# Patient Record
Sex: Female | Born: 1960
Health system: Southern US, Community
[De-identification: ages and names within clinical notes are randomized; demographics above are authoritative.]

## PROBLEM LIST (undated history)

## (undated) DIAGNOSIS — F1010 Alcohol abuse, uncomplicated: Secondary | ICD-10-CM

## (undated) DIAGNOSIS — E119 Type 2 diabetes mellitus without complications: Secondary | ICD-10-CM

## (undated) DIAGNOSIS — I10 Essential (primary) hypertension: Secondary | ICD-10-CM

## (undated) DIAGNOSIS — K852 Alcohol induced acute pancreatitis without necrosis or infection: Secondary | ICD-10-CM

## (undated) DIAGNOSIS — E079 Disorder of thyroid, unspecified: Secondary | ICD-10-CM

## (undated) DIAGNOSIS — E785 Hyperlipidemia, unspecified: Secondary | ICD-10-CM

## (undated) HISTORY — DX: Alcohol induced acute pancreatitis without necrosis or infection: K85.20

## (undated) HISTORY — DX: Disorder of thyroid, unspecified: E07.9

## (undated) HISTORY — DX: Hyperlipidemia, unspecified: E78.5

## (undated) HISTORY — DX: Essential (primary) hypertension: I10

## (undated) HISTORY — DX: Alcohol abuse, uncomplicated: F10.10

## (undated) HISTORY — DX: Type 2 diabetes mellitus without complications: E11.9

## (undated) HISTORY — PX: CHOLECYSTECTOMY: SHX55

## (undated) HISTORY — PX: ABDOMINAL HYSTERECTOMY: SHX81

---

## 2005-05-16 ENCOUNTER — Ambulatory Visit (HOSPITAL_COMMUNITY): Admission: RE | Admit: 2005-05-16 | Discharge: 2005-05-16 | Payer: Self-pay | Admitting: Orthopaedic Surgery

## 2005-11-04 ENCOUNTER — Ambulatory Visit (HOSPITAL_COMMUNITY): Admission: RE | Admit: 2005-11-04 | Discharge: 2005-11-04 | Payer: Self-pay | Admitting: Orthopaedic Surgery

## 2008-01-30 ENCOUNTER — Ambulatory Visit (HOSPITAL_COMMUNITY): Admission: RE | Admit: 2008-01-30 | Discharge: 2008-01-30 | Payer: Self-pay | Admitting: Orthopaedic Surgery

## 2009-08-05 ENCOUNTER — Ambulatory Visit: Payer: Self-pay | Admitting: Cardiology

## 2011-03-29 ENCOUNTER — Other Ambulatory Visit: Payer: Self-pay | Admitting: Family Medicine

## 2011-03-29 DIAGNOSIS — R921 Mammographic calcification found on diagnostic imaging of breast: Secondary | ICD-10-CM

## 2011-04-14 ENCOUNTER — Inpatient Hospital Stay: Admission: RE | Admit: 2011-04-14 | Payer: Self-pay | Source: Ambulatory Visit

## 2011-09-08 ENCOUNTER — Other Ambulatory Visit: Payer: Self-pay | Admitting: Diagnostic Radiology

## 2011-09-08 ENCOUNTER — Ambulatory Visit
Admission: RE | Admit: 2011-09-08 | Discharge: 2011-09-08 | Disposition: A | Discharge status: Home / Self Care | Payer: Medicare Other | Source: Ambulatory Visit | Attending: Family Medicine | Admitting: Family Medicine

## 2011-09-08 DIAGNOSIS — R921 Mammographic calcification found on diagnostic imaging of breast: Secondary | ICD-10-CM

## 2011-10-12 DIAGNOSIS — E039 Hypothyroidism, unspecified: Secondary | ICD-10-CM | POA: Diagnosis not present

## 2011-10-12 DIAGNOSIS — E876 Hypokalemia: Secondary | ICD-10-CM | POA: Diagnosis not present

## 2011-10-12 DIAGNOSIS — I10 Essential (primary) hypertension: Secondary | ICD-10-CM | POA: Diagnosis not present

## 2011-10-19 DIAGNOSIS — E7800 Pure hypercholesterolemia, unspecified: Secondary | ICD-10-CM | POA: Diagnosis not present

## 2011-10-19 DIAGNOSIS — IMO0001 Reserved for inherently not codable concepts without codable children: Secondary | ICD-10-CM | POA: Diagnosis not present

## 2011-10-19 DIAGNOSIS — Z23 Encounter for immunization: Secondary | ICD-10-CM | POA: Diagnosis not present

## 2011-10-19 DIAGNOSIS — F329 Major depressive disorder, single episode, unspecified: Secondary | ICD-10-CM | POA: Diagnosis not present

## 2011-10-19 DIAGNOSIS — G4700 Insomnia, unspecified: Secondary | ICD-10-CM | POA: Diagnosis not present

## 2011-10-19 DIAGNOSIS — F3289 Other specified depressive episodes: Secondary | ICD-10-CM | POA: Diagnosis not present

## 2012-02-09 DIAGNOSIS — IMO0001 Reserved for inherently not codable concepts without codable children: Secondary | ICD-10-CM | POA: Diagnosis not present

## 2012-02-09 DIAGNOSIS — E7800 Pure hypercholesterolemia, unspecified: Secondary | ICD-10-CM | POA: Diagnosis not present

## 2012-02-09 DIAGNOSIS — G4700 Insomnia, unspecified: Secondary | ICD-10-CM | POA: Diagnosis not present

## 2012-02-16 DIAGNOSIS — F3289 Other specified depressive episodes: Secondary | ICD-10-CM | POA: Diagnosis not present

## 2012-02-16 DIAGNOSIS — E7800 Pure hypercholesterolemia, unspecified: Secondary | ICD-10-CM | POA: Diagnosis not present

## 2012-02-16 DIAGNOSIS — IMO0001 Reserved for inherently not codable concepts without codable children: Secondary | ICD-10-CM | POA: Diagnosis not present

## 2012-02-16 DIAGNOSIS — G4700 Insomnia, unspecified: Secondary | ICD-10-CM | POA: Diagnosis not present

## 2012-02-16 DIAGNOSIS — F329 Major depressive disorder, single episode, unspecified: Secondary | ICD-10-CM | POA: Diagnosis not present

## 2012-02-21 DIAGNOSIS — I10 Essential (primary) hypertension: Secondary | ICD-10-CM | POA: Diagnosis not present

## 2012-02-21 DIAGNOSIS — Z79899 Other long term (current) drug therapy: Secondary | ICD-10-CM | POA: Diagnosis not present

## 2012-02-21 DIAGNOSIS — R0789 Other chest pain: Secondary | ICD-10-CM | POA: Diagnosis not present

## 2012-02-21 DIAGNOSIS — R079 Chest pain, unspecified: Secondary | ICD-10-CM | POA: Diagnosis not present

## 2012-02-28 DIAGNOSIS — R071 Chest pain on breathing: Secondary | ICD-10-CM | POA: Diagnosis not present

## 2012-06-19 DIAGNOSIS — F329 Major depressive disorder, single episode, unspecified: Secondary | ICD-10-CM | POA: Diagnosis not present

## 2012-06-19 DIAGNOSIS — F3289 Other specified depressive episodes: Secondary | ICD-10-CM | POA: Diagnosis not present

## 2012-06-19 DIAGNOSIS — E7800 Pure hypercholesterolemia, unspecified: Secondary | ICD-10-CM | POA: Diagnosis not present

## 2012-06-19 DIAGNOSIS — G4700 Insomnia, unspecified: Secondary | ICD-10-CM | POA: Diagnosis not present

## 2012-06-19 DIAGNOSIS — IMO0001 Reserved for inherently not codable concepts without codable children: Secondary | ICD-10-CM | POA: Diagnosis not present

## 2012-07-12 DIAGNOSIS — H3340 Traction detachment of retina, unspecified eye: Secondary | ICD-10-CM | POA: Diagnosis not present

## 2012-07-30 DIAGNOSIS — G4700 Insomnia, unspecified: Secondary | ICD-10-CM | POA: Diagnosis not present

## 2012-07-30 DIAGNOSIS — IMO0001 Reserved for inherently not codable concepts without codable children: Secondary | ICD-10-CM | POA: Diagnosis not present

## 2012-07-30 DIAGNOSIS — E7800 Pure hypercholesterolemia, unspecified: Secondary | ICD-10-CM | POA: Diagnosis not present

## 2012-08-02 DIAGNOSIS — F329 Major depressive disorder, single episode, unspecified: Secondary | ICD-10-CM | POA: Diagnosis not present

## 2012-08-02 DIAGNOSIS — IMO0001 Reserved for inherently not codable concepts without codable children: Secondary | ICD-10-CM | POA: Diagnosis not present

## 2012-08-02 DIAGNOSIS — G4700 Insomnia, unspecified: Secondary | ICD-10-CM | POA: Diagnosis not present

## 2012-08-02 DIAGNOSIS — E7800 Pure hypercholesterolemia, unspecified: Secondary | ICD-10-CM | POA: Diagnosis not present

## 2012-08-02 DIAGNOSIS — E109 Type 1 diabetes mellitus without complications: Secondary | ICD-10-CM | POA: Diagnosis not present

## 2012-08-02 DIAGNOSIS — F3289 Other specified depressive episodes: Secondary | ICD-10-CM | POA: Diagnosis not present

## 2012-10-17 DIAGNOSIS — Z23 Encounter for immunization: Secondary | ICD-10-CM | POA: Diagnosis not present

## 2012-11-21 DIAGNOSIS — IMO0001 Reserved for inherently not codable concepts without codable children: Secondary | ICD-10-CM | POA: Diagnosis not present

## 2012-11-21 DIAGNOSIS — E7800 Pure hypercholesterolemia, unspecified: Secondary | ICD-10-CM | POA: Diagnosis not present

## 2012-11-21 DIAGNOSIS — F3289 Other specified depressive episodes: Secondary | ICD-10-CM | POA: Diagnosis not present

## 2012-11-21 DIAGNOSIS — F329 Major depressive disorder, single episode, unspecified: Secondary | ICD-10-CM | POA: Diagnosis not present

## 2012-11-29 DIAGNOSIS — K219 Gastro-esophageal reflux disease without esophagitis: Secondary | ICD-10-CM | POA: Diagnosis not present

## 2012-11-29 DIAGNOSIS — IMO0001 Reserved for inherently not codable concepts without codable children: Secondary | ICD-10-CM | POA: Diagnosis not present

## 2012-11-29 DIAGNOSIS — I10 Essential (primary) hypertension: Secondary | ICD-10-CM | POA: Diagnosis not present

## 2012-11-29 DIAGNOSIS — F3289 Other specified depressive episodes: Secondary | ICD-10-CM | POA: Diagnosis not present

## 2012-11-29 DIAGNOSIS — E7800 Pure hypercholesterolemia, unspecified: Secondary | ICD-10-CM | POA: Diagnosis not present

## 2012-11-29 DIAGNOSIS — E039 Hypothyroidism, unspecified: Secondary | ICD-10-CM | POA: Diagnosis not present

## 2013-01-24 DIAGNOSIS — Z1231 Encounter for screening mammogram for malignant neoplasm of breast: Secondary | ICD-10-CM | POA: Diagnosis not present

## 2013-04-16 DIAGNOSIS — R5381 Other malaise: Secondary | ICD-10-CM | POA: Diagnosis not present

## 2013-04-16 DIAGNOSIS — E039 Hypothyroidism, unspecified: Secondary | ICD-10-CM | POA: Diagnosis not present

## 2013-04-16 DIAGNOSIS — R5383 Other fatigue: Secondary | ICD-10-CM | POA: Diagnosis not present

## 2013-05-13 DIAGNOSIS — S92919A Unspecified fracture of unspecified toe(s), initial encounter for closed fracture: Secondary | ICD-10-CM | POA: Diagnosis not present

## 2013-05-13 DIAGNOSIS — S20229A Contusion of unspecified back wall of thorax, initial encounter: Secondary | ICD-10-CM | POA: Diagnosis not present

## 2013-05-13 DIAGNOSIS — M545 Low back pain, unspecified: Secondary | ICD-10-CM | POA: Diagnosis not present

## 2013-05-13 DIAGNOSIS — I10 Essential (primary) hypertension: Secondary | ICD-10-CM | POA: Diagnosis not present

## 2013-05-13 DIAGNOSIS — Z79899 Other long term (current) drug therapy: Secondary | ICD-10-CM | POA: Diagnosis not present

## 2013-05-13 DIAGNOSIS — IMO0002 Reserved for concepts with insufficient information to code with codable children: Secondary | ICD-10-CM | POA: Diagnosis not present

## 2013-05-13 DIAGNOSIS — E119 Type 2 diabetes mellitus without complications: Secondary | ICD-10-CM | POA: Diagnosis not present

## 2013-05-22 DIAGNOSIS — IMO0001 Reserved for inherently not codable concepts without codable children: Secondary | ICD-10-CM | POA: Diagnosis not present

## 2013-05-22 DIAGNOSIS — K219 Gastro-esophageal reflux disease without esophagitis: Secondary | ICD-10-CM | POA: Diagnosis not present

## 2013-05-22 DIAGNOSIS — E876 Hypokalemia: Secondary | ICD-10-CM | POA: Diagnosis not present

## 2013-05-22 DIAGNOSIS — E7800 Pure hypercholesterolemia, unspecified: Secondary | ICD-10-CM | POA: Diagnosis not present

## 2013-05-22 DIAGNOSIS — E039 Hypothyroidism, unspecified: Secondary | ICD-10-CM | POA: Diagnosis not present

## 2013-05-22 DIAGNOSIS — I10 Essential (primary) hypertension: Secondary | ICD-10-CM | POA: Diagnosis not present

## 2013-05-24 DIAGNOSIS — IMO0001 Reserved for inherently not codable concepts without codable children: Secondary | ICD-10-CM | POA: Diagnosis not present

## 2013-05-24 DIAGNOSIS — E039 Hypothyroidism, unspecified: Secondary | ICD-10-CM | POA: Diagnosis not present

## 2013-05-24 DIAGNOSIS — S92919A Unspecified fracture of unspecified toe(s), initial encounter for closed fracture: Secondary | ICD-10-CM | POA: Diagnosis not present

## 2013-06-17 DIAGNOSIS — S92919A Unspecified fracture of unspecified toe(s), initial encounter for closed fracture: Secondary | ICD-10-CM | POA: Diagnosis not present

## 2013-07-31 DIAGNOSIS — Z23 Encounter for immunization: Secondary | ICD-10-CM | POA: Diagnosis not present

## 2013-08-12 DIAGNOSIS — J019 Acute sinusitis, unspecified: Secondary | ICD-10-CM | POA: Diagnosis not present

## 2013-08-12 DIAGNOSIS — J45901 Unspecified asthma with (acute) exacerbation: Secondary | ICD-10-CM | POA: Diagnosis not present

## 2013-08-19 DIAGNOSIS — S92919A Unspecified fracture of unspecified toe(s), initial encounter for closed fracture: Secondary | ICD-10-CM | POA: Diagnosis not present

## 2013-08-19 DIAGNOSIS — S90129A Contusion of unspecified lesser toe(s) without damage to nail, initial encounter: Secondary | ICD-10-CM | POA: Diagnosis not present

## 2013-08-30 DIAGNOSIS — F325 Major depressive disorder, single episode, in full remission: Secondary | ICD-10-CM | POA: Diagnosis not present

## 2013-08-30 DIAGNOSIS — Z23 Encounter for immunization: Secondary | ICD-10-CM | POA: Diagnosis not present

## 2013-08-30 DIAGNOSIS — E039 Hypothyroidism, unspecified: Secondary | ICD-10-CM | POA: Diagnosis not present

## 2013-08-30 DIAGNOSIS — N19 Unspecified kidney failure: Secondary | ICD-10-CM | POA: Diagnosis not present

## 2013-08-30 DIAGNOSIS — E119 Type 2 diabetes mellitus without complications: Secondary | ICD-10-CM | POA: Diagnosis not present

## 2013-08-30 DIAGNOSIS — K5289 Other specified noninfective gastroenteritis and colitis: Secondary | ICD-10-CM | POA: Diagnosis not present

## 2013-08-30 DIAGNOSIS — N289 Disorder of kidney and ureter, unspecified: Secondary | ICD-10-CM | POA: Diagnosis not present

## 2013-08-30 DIAGNOSIS — K219 Gastro-esophageal reflux disease without esophagitis: Secondary | ICD-10-CM | POA: Diagnosis not present

## 2013-08-30 DIAGNOSIS — E871 Hypo-osmolality and hyponatremia: Secondary | ICD-10-CM | POA: Diagnosis not present

## 2013-08-30 DIAGNOSIS — Z8249 Family history of ischemic heart disease and other diseases of the circulatory system: Secondary | ICD-10-CM | POA: Diagnosis not present

## 2013-08-30 DIAGNOSIS — I959 Hypotension, unspecified: Secondary | ICD-10-CM | POA: Diagnosis not present

## 2013-08-30 DIAGNOSIS — Z833 Family history of diabetes mellitus: Secondary | ICD-10-CM | POA: Diagnosis not present

## 2013-08-30 DIAGNOSIS — E860 Dehydration: Secondary | ICD-10-CM | POA: Diagnosis not present

## 2013-08-30 DIAGNOSIS — R1110 Vomiting, unspecified: Secondary | ICD-10-CM | POA: Diagnosis not present

## 2013-08-30 DIAGNOSIS — I10 Essential (primary) hypertension: Secondary | ICD-10-CM | POA: Diagnosis not present

## 2013-08-30 DIAGNOSIS — Z79899 Other long term (current) drug therapy: Secondary | ICD-10-CM | POA: Diagnosis not present

## 2013-08-30 DIAGNOSIS — Z885 Allergy status to narcotic agent status: Secondary | ICD-10-CM | POA: Diagnosis not present

## 2013-08-31 DIAGNOSIS — E871 Hypo-osmolality and hyponatremia: Secondary | ICD-10-CM | POA: Diagnosis not present

## 2013-08-31 DIAGNOSIS — N19 Unspecified kidney failure: Secondary | ICD-10-CM | POA: Diagnosis not present

## 2013-09-09 DIAGNOSIS — E7800 Pure hypercholesterolemia, unspecified: Secondary | ICD-10-CM | POA: Diagnosis not present

## 2013-09-09 DIAGNOSIS — I10 Essential (primary) hypertension: Secondary | ICD-10-CM | POA: Diagnosis not present

## 2013-09-09 DIAGNOSIS — E876 Hypokalemia: Secondary | ICD-10-CM | POA: Diagnosis not present

## 2013-09-09 DIAGNOSIS — E039 Hypothyroidism, unspecified: Secondary | ICD-10-CM | POA: Diagnosis not present

## 2013-09-09 DIAGNOSIS — IMO0001 Reserved for inherently not codable concepts without codable children: Secondary | ICD-10-CM | POA: Diagnosis not present

## 2013-09-09 DIAGNOSIS — K219 Gastro-esophageal reflux disease without esophagitis: Secondary | ICD-10-CM | POA: Diagnosis not present

## 2013-09-09 DIAGNOSIS — E559 Vitamin D deficiency, unspecified: Secondary | ICD-10-CM | POA: Diagnosis not present

## 2013-09-09 DIAGNOSIS — E538 Deficiency of other specified B group vitamins: Secondary | ICD-10-CM | POA: Diagnosis not present

## 2013-09-29 DIAGNOSIS — E860 Dehydration: Secondary | ICD-10-CM | POA: Diagnosis not present

## 2013-09-29 DIAGNOSIS — E039 Hypothyroidism, unspecified: Secondary | ICD-10-CM | POA: Diagnosis not present

## 2013-11-18 DIAGNOSIS — E876 Hypokalemia: Secondary | ICD-10-CM | POA: Diagnosis not present

## 2013-11-18 DIAGNOSIS — IMO0001 Reserved for inherently not codable concepts without codable children: Secondary | ICD-10-CM | POA: Diagnosis not present

## 2013-11-18 DIAGNOSIS — E039 Hypothyroidism, unspecified: Secondary | ICD-10-CM | POA: Diagnosis not present

## 2013-11-18 DIAGNOSIS — K219 Gastro-esophageal reflux disease without esophagitis: Secondary | ICD-10-CM | POA: Diagnosis not present

## 2013-11-18 DIAGNOSIS — R5383 Other fatigue: Secondary | ICD-10-CM | POA: Diagnosis not present

## 2013-11-18 DIAGNOSIS — E7800 Pure hypercholesterolemia, unspecified: Secondary | ICD-10-CM | POA: Diagnosis not present

## 2013-11-18 DIAGNOSIS — R5381 Other malaise: Secondary | ICD-10-CM | POA: Diagnosis not present

## 2013-11-18 DIAGNOSIS — I10 Essential (primary) hypertension: Secondary | ICD-10-CM | POA: Diagnosis not present

## 2013-11-18 DIAGNOSIS — E782 Mixed hyperlipidemia: Secondary | ICD-10-CM | POA: Diagnosis not present

## 2013-11-25 DIAGNOSIS — R5383 Other fatigue: Secondary | ICD-10-CM | POA: Diagnosis not present

## 2013-11-25 DIAGNOSIS — IMO0001 Reserved for inherently not codable concepts without codable children: Secondary | ICD-10-CM | POA: Diagnosis not present

## 2013-11-25 DIAGNOSIS — G4700 Insomnia, unspecified: Secondary | ICD-10-CM | POA: Diagnosis not present

## 2013-11-25 DIAGNOSIS — E039 Hypothyroidism, unspecified: Secondary | ICD-10-CM | POA: Diagnosis not present

## 2013-11-25 DIAGNOSIS — E7800 Pure hypercholesterolemia, unspecified: Secondary | ICD-10-CM | POA: Diagnosis not present

## 2013-11-25 DIAGNOSIS — R5381 Other malaise: Secondary | ICD-10-CM | POA: Diagnosis not present

## 2014-02-04 DIAGNOSIS — M67919 Unspecified disorder of synovium and tendon, unspecified shoulder: Secondary | ICD-10-CM | POA: Diagnosis not present

## 2014-04-02 DIAGNOSIS — Z1231 Encounter for screening mammogram for malignant neoplasm of breast: Secondary | ICD-10-CM | POA: Diagnosis not present

## 2014-05-19 DIAGNOSIS — E039 Hypothyroidism, unspecified: Secondary | ICD-10-CM | POA: Diagnosis not present

## 2014-05-19 DIAGNOSIS — E7800 Pure hypercholesterolemia, unspecified: Secondary | ICD-10-CM | POA: Diagnosis not present

## 2014-05-19 DIAGNOSIS — K219 Gastro-esophageal reflux disease without esophagitis: Secondary | ICD-10-CM | POA: Diagnosis not present

## 2014-05-19 DIAGNOSIS — E876 Hypokalemia: Secondary | ICD-10-CM | POA: Diagnosis not present

## 2014-05-19 DIAGNOSIS — IMO0001 Reserved for inherently not codable concepts without codable children: Secondary | ICD-10-CM | POA: Diagnosis not present

## 2014-05-19 DIAGNOSIS — I10 Essential (primary) hypertension: Secondary | ICD-10-CM | POA: Diagnosis not present

## 2014-05-26 DIAGNOSIS — K219 Gastro-esophageal reflux disease without esophagitis: Secondary | ICD-10-CM | POA: Diagnosis not present

## 2014-05-26 DIAGNOSIS — E039 Hypothyroidism, unspecified: Secondary | ICD-10-CM | POA: Diagnosis not present

## 2014-05-26 DIAGNOSIS — IMO0001 Reserved for inherently not codable concepts without codable children: Secondary | ICD-10-CM | POA: Diagnosis not present

## 2014-05-31 DIAGNOSIS — Z79899 Other long term (current) drug therapy: Secondary | ICD-10-CM | POA: Diagnosis not present

## 2014-05-31 DIAGNOSIS — K219 Gastro-esophageal reflux disease without esophagitis: Secondary | ICD-10-CM | POA: Diagnosis not present

## 2014-05-31 DIAGNOSIS — F329 Major depressive disorder, single episode, unspecified: Secondary | ICD-10-CM | POA: Diagnosis not present

## 2014-05-31 DIAGNOSIS — D509 Iron deficiency anemia, unspecified: Secondary | ICD-10-CM | POA: Diagnosis not present

## 2014-05-31 DIAGNOSIS — E119 Type 2 diabetes mellitus without complications: Secondary | ICD-10-CM | POA: Diagnosis not present

## 2014-05-31 DIAGNOSIS — E039 Hypothyroidism, unspecified: Secondary | ICD-10-CM | POA: Diagnosis not present

## 2014-05-31 DIAGNOSIS — F3289 Other specified depressive episodes: Secondary | ICD-10-CM | POA: Diagnosis not present

## 2014-05-31 DIAGNOSIS — E878 Other disorders of electrolyte and fluid balance, not elsewhere classified: Secondary | ICD-10-CM | POA: Diagnosis not present

## 2014-05-31 DIAGNOSIS — Z885 Allergy status to narcotic agent status: Secondary | ICD-10-CM | POA: Diagnosis not present

## 2014-05-31 DIAGNOSIS — J9819 Other pulmonary collapse: Secondary | ICD-10-CM | POA: Diagnosis not present

## 2014-05-31 DIAGNOSIS — I10 Essential (primary) hypertension: Secondary | ICD-10-CM | POA: Diagnosis not present

## 2014-05-31 DIAGNOSIS — E871 Hypo-osmolality and hyponatremia: Secondary | ICD-10-CM | POA: Diagnosis not present

## 2014-06-01 DIAGNOSIS — E871 Hypo-osmolality and hyponatremia: Secondary | ICD-10-CM | POA: Diagnosis not present

## 2014-06-02 DIAGNOSIS — E871 Hypo-osmolality and hyponatremia: Secondary | ICD-10-CM | POA: Diagnosis not present

## 2014-06-03 DIAGNOSIS — E871 Hypo-osmolality and hyponatremia: Secondary | ICD-10-CM | POA: Diagnosis not present

## 2014-06-03 DIAGNOSIS — R112 Nausea with vomiting, unspecified: Secondary | ICD-10-CM | POA: Diagnosis not present

## 2014-06-17 DIAGNOSIS — E039 Hypothyroidism, unspecified: Secondary | ICD-10-CM | POA: Diagnosis not present

## 2014-06-17 DIAGNOSIS — I10 Essential (primary) hypertension: Secondary | ICD-10-CM | POA: Diagnosis not present

## 2014-06-17 DIAGNOSIS — E871 Hypo-osmolality and hyponatremia: Secondary | ICD-10-CM | POA: Diagnosis not present

## 2014-08-12 DIAGNOSIS — I10 Essential (primary) hypertension: Secondary | ICD-10-CM | POA: Diagnosis not present

## 2014-08-12 DIAGNOSIS — E780 Pure hypercholesterolemia: Secondary | ICD-10-CM | POA: Diagnosis not present

## 2014-08-12 DIAGNOSIS — D649 Anemia, unspecified: Secondary | ICD-10-CM | POA: Diagnosis not present

## 2014-08-12 DIAGNOSIS — R5383 Other fatigue: Secondary | ICD-10-CM | POA: Diagnosis not present

## 2014-08-29 DIAGNOSIS — F329 Major depressive disorder, single episode, unspecified: Secondary | ICD-10-CM | POA: Diagnosis not present

## 2014-09-02 DIAGNOSIS — R531 Weakness: Secondary | ICD-10-CM | POA: Diagnosis not present

## 2014-09-02 DIAGNOSIS — R634 Abnormal weight loss: Secondary | ICD-10-CM | POA: Diagnosis not present

## 2014-09-02 DIAGNOSIS — E038 Other specified hypothyroidism: Secondary | ICD-10-CM | POA: Diagnosis not present

## 2014-11-04 DIAGNOSIS — E1165 Type 2 diabetes mellitus with hyperglycemia: Secondary | ICD-10-CM | POA: Diagnosis not present

## 2014-11-04 DIAGNOSIS — E780 Pure hypercholesterolemia: Secondary | ICD-10-CM | POA: Diagnosis not present

## 2014-11-04 DIAGNOSIS — E038 Other specified hypothyroidism: Secondary | ICD-10-CM | POA: Diagnosis not present

## 2014-11-04 DIAGNOSIS — K219 Gastro-esophageal reflux disease without esophagitis: Secondary | ICD-10-CM | POA: Diagnosis not present

## 2014-11-04 DIAGNOSIS — I10 Essential (primary) hypertension: Secondary | ICD-10-CM | POA: Diagnosis not present

## 2014-11-11 DIAGNOSIS — E780 Pure hypercholesterolemia: Secondary | ICD-10-CM | POA: Diagnosis not present

## 2014-11-11 DIAGNOSIS — Z1389 Encounter for screening for other disorder: Secondary | ICD-10-CM | POA: Diagnosis not present

## 2014-11-11 DIAGNOSIS — I10 Essential (primary) hypertension: Secondary | ICD-10-CM | POA: Diagnosis not present

## 2014-11-11 DIAGNOSIS — E038 Other specified hypothyroidism: Secondary | ICD-10-CM | POA: Diagnosis not present

## 2014-11-11 DIAGNOSIS — E1165 Type 2 diabetes mellitus with hyperglycemia: Secondary | ICD-10-CM | POA: Diagnosis not present

## 2014-11-11 DIAGNOSIS — Z0000 Encounter for general adult medical examination without abnormal findings: Secondary | ICD-10-CM | POA: Diagnosis not present

## 2014-11-11 DIAGNOSIS — F328 Other depressive episodes: Secondary | ICD-10-CM | POA: Diagnosis not present

## 2014-11-11 DIAGNOSIS — E876 Hypokalemia: Secondary | ICD-10-CM | POA: Diagnosis not present

## 2014-11-11 DIAGNOSIS — K219 Gastro-esophageal reflux disease without esophagitis: Secondary | ICD-10-CM | POA: Diagnosis not present

## 2015-02-12 DIAGNOSIS — E119 Type 2 diabetes mellitus without complications: Secondary | ICD-10-CM | POA: Diagnosis not present

## 2015-04-07 DIAGNOSIS — Z1231 Encounter for screening mammogram for malignant neoplasm of breast: Secondary | ICD-10-CM | POA: Diagnosis not present

## 2015-04-27 DIAGNOSIS — L509 Urticaria, unspecified: Secondary | ICD-10-CM | POA: Diagnosis not present

## 2015-04-27 DIAGNOSIS — R197 Diarrhea, unspecified: Secondary | ICD-10-CM | POA: Diagnosis not present

## 2015-05-11 DIAGNOSIS — E780 Pure hypercholesterolemia: Secondary | ICD-10-CM | POA: Diagnosis not present

## 2015-05-11 DIAGNOSIS — I10 Essential (primary) hypertension: Secondary | ICD-10-CM | POA: Diagnosis not present

## 2015-05-11 DIAGNOSIS — R5383 Other fatigue: Secondary | ICD-10-CM | POA: Diagnosis not present

## 2015-05-11 DIAGNOSIS — E1165 Type 2 diabetes mellitus with hyperglycemia: Secondary | ICD-10-CM | POA: Diagnosis not present

## 2015-05-11 DIAGNOSIS — K219 Gastro-esophageal reflux disease without esophagitis: Secondary | ICD-10-CM | POA: Diagnosis not present

## 2015-05-18 DIAGNOSIS — K219 Gastro-esophageal reflux disease without esophagitis: Secondary | ICD-10-CM | POA: Diagnosis not present

## 2015-05-18 DIAGNOSIS — E1165 Type 2 diabetes mellitus with hyperglycemia: Secondary | ICD-10-CM | POA: Diagnosis not present

## 2015-05-18 DIAGNOSIS — I10 Essential (primary) hypertension: Secondary | ICD-10-CM | POA: Diagnosis not present

## 2015-05-18 DIAGNOSIS — E780 Pure hypercholesterolemia: Secondary | ICD-10-CM | POA: Diagnosis not present

## 2015-05-18 DIAGNOSIS — E038 Other specified hypothyroidism: Secondary | ICD-10-CM | POA: Diagnosis not present

## 2015-05-18 DIAGNOSIS — E876 Hypokalemia: Secondary | ICD-10-CM | POA: Diagnosis not present

## 2015-05-18 DIAGNOSIS — F328 Other depressive episodes: Secondary | ICD-10-CM | POA: Diagnosis not present

## 2015-05-18 DIAGNOSIS — Z0000 Encounter for general adult medical examination without abnormal findings: Secondary | ICD-10-CM | POA: Diagnosis not present

## 2015-07-02 DIAGNOSIS — M79675 Pain in left toe(s): Secondary | ICD-10-CM | POA: Diagnosis not present

## 2015-07-02 DIAGNOSIS — M79672 Pain in left foot: Secondary | ICD-10-CM | POA: Diagnosis not present

## 2015-07-02 DIAGNOSIS — S92322A Displaced fracture of second metatarsal bone, left foot, initial encounter for closed fracture: Secondary | ICD-10-CM | POA: Diagnosis not present

## 2015-07-30 DIAGNOSIS — S92322D Displaced fracture of second metatarsal bone, left foot, subsequent encounter for fracture with routine healing: Secondary | ICD-10-CM | POA: Diagnosis not present

## 2015-07-30 DIAGNOSIS — M79671 Pain in right foot: Secondary | ICD-10-CM | POA: Diagnosis not present

## 2015-11-24 DIAGNOSIS — K219 Gastro-esophageal reflux disease without esophagitis: Secondary | ICD-10-CM | POA: Diagnosis not present

## 2015-11-24 DIAGNOSIS — I10 Essential (primary) hypertension: Secondary | ICD-10-CM | POA: Diagnosis not present

## 2015-11-24 DIAGNOSIS — E038 Other specified hypothyroidism: Secondary | ICD-10-CM | POA: Diagnosis not present

## 2015-11-24 DIAGNOSIS — E876 Hypokalemia: Secondary | ICD-10-CM | POA: Diagnosis not present

## 2015-11-24 DIAGNOSIS — E7800 Pure hypercholesterolemia, unspecified: Secondary | ICD-10-CM | POA: Diagnosis not present

## 2015-11-24 DIAGNOSIS — E1165 Type 2 diabetes mellitus with hyperglycemia: Secondary | ICD-10-CM | POA: Diagnosis not present

## 2015-12-01 DIAGNOSIS — E1165 Type 2 diabetes mellitus with hyperglycemia: Secondary | ICD-10-CM | POA: Diagnosis not present

## 2015-12-01 DIAGNOSIS — E038 Other specified hypothyroidism: Secondary | ICD-10-CM | POA: Diagnosis not present

## 2015-12-01 DIAGNOSIS — I10 Essential (primary) hypertension: Secondary | ICD-10-CM | POA: Diagnosis not present

## 2015-12-01 DIAGNOSIS — K219 Gastro-esophageal reflux disease without esophagitis: Secondary | ICD-10-CM | POA: Diagnosis not present

## 2015-12-01 DIAGNOSIS — Z1389 Encounter for screening for other disorder: Secondary | ICD-10-CM | POA: Diagnosis not present

## 2015-12-01 DIAGNOSIS — Z0001 Encounter for general adult medical examination with abnormal findings: Secondary | ICD-10-CM | POA: Diagnosis not present

## 2015-12-28 DIAGNOSIS — E860 Dehydration: Secondary | ICD-10-CM | POA: Diagnosis not present

## 2015-12-28 DIAGNOSIS — K760 Fatty (change of) liver, not elsewhere classified: Secondary | ICD-10-CM | POA: Diagnosis not present

## 2015-12-28 DIAGNOSIS — E119 Type 2 diabetes mellitus without complications: Secondary | ICD-10-CM | POA: Diagnosis not present

## 2015-12-28 DIAGNOSIS — R110 Nausea: Secondary | ICD-10-CM | POA: Diagnosis not present

## 2015-12-28 DIAGNOSIS — I10 Essential (primary) hypertension: Secondary | ICD-10-CM | POA: Diagnosis not present

## 2015-12-28 DIAGNOSIS — E876 Hypokalemia: Secondary | ICD-10-CM | POA: Diagnosis not present

## 2015-12-28 DIAGNOSIS — R935 Abnormal findings on diagnostic imaging of other abdominal regions, including retroperitoneum: Secondary | ICD-10-CM | POA: Diagnosis not present

## 2015-12-28 DIAGNOSIS — R109 Unspecified abdominal pain: Secondary | ICD-10-CM | POA: Diagnosis not present

## 2015-12-28 DIAGNOSIS — N289 Disorder of kidney and ureter, unspecified: Secondary | ICD-10-CM | POA: Diagnosis not present

## 2015-12-28 DIAGNOSIS — K8689 Other specified diseases of pancreas: Secondary | ICD-10-CM | POA: Diagnosis not present

## 2015-12-28 DIAGNOSIS — E039 Hypothyroidism, unspecified: Secondary | ICD-10-CM | POA: Diagnosis not present

## 2015-12-28 DIAGNOSIS — K219 Gastro-esophageal reflux disease without esophagitis: Secondary | ICD-10-CM | POA: Diagnosis not present

## 2015-12-28 DIAGNOSIS — K859 Acute pancreatitis without necrosis or infection, unspecified: Secondary | ICD-10-CM | POA: Diagnosis not present

## 2015-12-28 DIAGNOSIS — K858 Other acute pancreatitis without necrosis or infection: Secondary | ICD-10-CM | POA: Diagnosis not present

## 2015-12-28 DIAGNOSIS — I9589 Other hypotension: Secondary | ICD-10-CM | POA: Diagnosis not present

## 2016-01-29 DIAGNOSIS — E038 Other specified hypothyroidism: Secondary | ICD-10-CM | POA: Diagnosis not present

## 2016-01-29 DIAGNOSIS — E1165 Type 2 diabetes mellitus with hyperglycemia: Secondary | ICD-10-CM | POA: Diagnosis not present

## 2016-01-29 DIAGNOSIS — R634 Abnormal weight loss: Secondary | ICD-10-CM | POA: Diagnosis not present

## 2016-01-29 DIAGNOSIS — I10 Essential (primary) hypertension: Secondary | ICD-10-CM | POA: Diagnosis not present

## 2016-01-30 DIAGNOSIS — K8689 Other specified diseases of pancreas: Secondary | ICD-10-CM | POA: Diagnosis not present

## 2016-01-30 DIAGNOSIS — R634 Abnormal weight loss: Secondary | ICD-10-CM | POA: Diagnosis not present

## 2016-01-30 DIAGNOSIS — E876 Hypokalemia: Secondary | ICD-10-CM | POA: Diagnosis not present

## 2016-01-30 DIAGNOSIS — K859 Acute pancreatitis without necrosis or infection, unspecified: Secondary | ICD-10-CM | POA: Diagnosis not present

## 2016-02-12 DIAGNOSIS — K8500 Idiopathic acute pancreatitis without necrosis or infection: Secondary | ICD-10-CM | POA: Diagnosis not present

## 2016-02-12 DIAGNOSIS — I10 Essential (primary) hypertension: Secondary | ICD-10-CM | POA: Diagnosis not present

## 2016-02-12 DIAGNOSIS — R935 Abnormal findings on diagnostic imaging of other abdominal regions, including retroperitoneum: Secondary | ICD-10-CM | POA: Diagnosis not present

## 2016-02-12 DIAGNOSIS — E039 Hypothyroidism, unspecified: Secondary | ICD-10-CM | POA: Diagnosis not present

## 2016-02-12 DIAGNOSIS — Z79899 Other long term (current) drug therapy: Secondary | ICD-10-CM | POA: Diagnosis not present

## 2016-02-12 DIAGNOSIS — E119 Type 2 diabetes mellitus without complications: Secondary | ICD-10-CM | POA: Diagnosis not present

## 2016-03-09 DIAGNOSIS — E119 Type 2 diabetes mellitus without complications: Secondary | ICD-10-CM | POA: Diagnosis not present

## 2016-03-09 DIAGNOSIS — F329 Major depressive disorder, single episode, unspecified: Secondary | ICD-10-CM | POA: Diagnosis not present

## 2016-03-09 DIAGNOSIS — Z8711 Personal history of peptic ulcer disease: Secondary | ICD-10-CM | POA: Diagnosis not present

## 2016-03-09 DIAGNOSIS — K8500 Idiopathic acute pancreatitis without necrosis or infection: Secondary | ICD-10-CM | POA: Diagnosis not present

## 2016-03-09 DIAGNOSIS — E039 Hypothyroidism, unspecified: Secondary | ICD-10-CM | POA: Diagnosis not present

## 2016-03-09 DIAGNOSIS — K219 Gastro-esophageal reflux disease without esophagitis: Secondary | ICD-10-CM | POA: Diagnosis not present

## 2016-03-09 DIAGNOSIS — D509 Iron deficiency anemia, unspecified: Secondary | ICD-10-CM | POA: Diagnosis not present

## 2016-03-09 DIAGNOSIS — I10 Essential (primary) hypertension: Secondary | ICD-10-CM | POA: Diagnosis not present

## 2016-03-09 DIAGNOSIS — K861 Other chronic pancreatitis: Secondary | ICD-10-CM | POA: Diagnosis not present

## 2016-03-09 DIAGNOSIS — Z885 Allergy status to narcotic agent status: Secondary | ICD-10-CM | POA: Diagnosis not present

## 2016-04-12 DIAGNOSIS — K869 Disease of pancreas, unspecified: Secondary | ICD-10-CM | POA: Diagnosis not present

## 2016-04-25 DIAGNOSIS — Z1231 Encounter for screening mammogram for malignant neoplasm of breast: Secondary | ICD-10-CM | POA: Diagnosis not present

## 2016-05-11 DIAGNOSIS — R922 Inconclusive mammogram: Secondary | ICD-10-CM | POA: Diagnosis not present

## 2016-05-11 DIAGNOSIS — N6489 Other specified disorders of breast: Secondary | ICD-10-CM | POA: Diagnosis not present

## 2016-05-11 DIAGNOSIS — R928 Other abnormal and inconclusive findings on diagnostic imaging of breast: Secondary | ICD-10-CM | POA: Diagnosis not present

## 2016-05-18 DIAGNOSIS — E876 Hypokalemia: Secondary | ICD-10-CM | POA: Diagnosis not present

## 2016-05-18 DIAGNOSIS — E1165 Type 2 diabetes mellitus with hyperglycemia: Secondary | ICD-10-CM | POA: Diagnosis not present

## 2016-05-18 DIAGNOSIS — I10 Essential (primary) hypertension: Secondary | ICD-10-CM | POA: Diagnosis not present

## 2016-05-18 DIAGNOSIS — E7800 Pure hypercholesterolemia, unspecified: Secondary | ICD-10-CM | POA: Diagnosis not present

## 2016-05-18 DIAGNOSIS — Z13220 Encounter for screening for lipoid disorders: Secondary | ICD-10-CM | POA: Diagnosis not present

## 2016-05-18 DIAGNOSIS — R634 Abnormal weight loss: Secondary | ICD-10-CM | POA: Diagnosis not present

## 2016-05-18 DIAGNOSIS — E860 Dehydration: Secondary | ICD-10-CM | POA: Diagnosis not present

## 2016-05-18 DIAGNOSIS — K219 Gastro-esophageal reflux disease without esophagitis: Secondary | ICD-10-CM | POA: Diagnosis not present

## 2016-05-25 DIAGNOSIS — Z6820 Body mass index (BMI) 20.0-20.9, adult: Secondary | ICD-10-CM | POA: Diagnosis not present

## 2016-05-25 DIAGNOSIS — K219 Gastro-esophageal reflux disease without esophagitis: Secondary | ICD-10-CM | POA: Diagnosis not present

## 2016-05-25 DIAGNOSIS — K859 Acute pancreatitis without necrosis or infection, unspecified: Secondary | ICD-10-CM | POA: Diagnosis not present

## 2016-05-25 DIAGNOSIS — R5383 Other fatigue: Secondary | ICD-10-CM | POA: Diagnosis not present

## 2016-05-25 DIAGNOSIS — I10 Essential (primary) hypertension: Secondary | ICD-10-CM | POA: Diagnosis not present

## 2016-05-25 DIAGNOSIS — E038 Other specified hypothyroidism: Secondary | ICD-10-CM | POA: Diagnosis not present

## 2016-05-25 DIAGNOSIS — E1165 Type 2 diabetes mellitus with hyperglycemia: Secondary | ICD-10-CM | POA: Diagnosis not present

## 2016-07-07 ENCOUNTER — Encounter: Payer: Self-pay | Admitting: Gastroenterology

## 2016-07-13 ENCOUNTER — Encounter (INDEPENDENT_AMBULATORY_CARE_PROVIDER_SITE_OTHER): Payer: Medicare Other | Admitting: Gastroenterology

## 2016-07-13 ENCOUNTER — Telehealth: Payer: Self-pay | Admitting: Gastroenterology

## 2016-07-13 NOTE — Progress Notes (Signed)
   Subjective:    Patient ID: Kayla SpangleAnnie D Beebe, female    DOB: 06/24/1961, 55 y.o.   MRN: [id number]  NO SHOW  HPI    Review of Systems     Objective:   Physical Exam        Assessment & Plan:

## 2016-07-13 NOTE — Telephone Encounter (Signed)
REVIEWED-NO ADDITIONAL RECOMMENDATIONS. 

## 2016-07-13 NOTE — Telephone Encounter (Signed)
NOTIFIED REFERRING PHYSICIAN OFFICE THAT PATIENT WAS A NO SHOW TO APPOINTMENT

## 2016-07-27 ENCOUNTER — Encounter (INDEPENDENT_AMBULATORY_CARE_PROVIDER_SITE_OTHER): Payer: Self-pay

## 2016-07-27 ENCOUNTER — Ambulatory Visit (INDEPENDENT_AMBULATORY_CARE_PROVIDER_SITE_OTHER): Payer: Commercial Managed Care - HMO | Admitting: Gastroenterology

## 2016-07-27 ENCOUNTER — Encounter: Payer: Self-pay | Admitting: Gastroenterology

## 2016-07-27 DIAGNOSIS — R197 Diarrhea, unspecified: Secondary | ICD-10-CM | POA: Insufficient documentation

## 2016-07-27 MED ORDER — PANCRELIPASE (LIP-PROT-AMYL) 36000-114000 UNITS PO CPEP: 108000.0000 [IU] | ORAL_CAPSULE | Freq: Three times a day (TID) | ORAL | 11 refills | Status: DC

## 2016-07-27 NOTE — Patient Instructions (Addendum)
SUBMIT STOOL STUDIES. MAY NEED 24 HR STOOL COLLECTION THEN EGD/TCS AND/OR HBT FOR SIBO.  Complete labs.  After stool and labs submitted, START CREON 3 WITH MEALS AND 2 WITH SNACKS.  FOLLOW A DAIRY FREE DIET.  SEE INFO BELOW.  FOLLOW UP IN 2 MOS.     Lactose Free Diet Lactose is a carbohydrate that is found mainly in milk and milk products, as well as in foods with added milk or whey. Lactose must be digested by the enzyme in order to be used by the body. Lactose intolerance occurs when there is a shortage of lactase. When your body is not able to digest lactose, you may feel sick to your stomach (nausea), bloating, cramping, gas and diarrhea.  There are many dairy products that may be tolerated better than milk by some people:  The use of cultured dairy products such as yogurt, buttermilk, cottage cheese, and sweet acidophilus milk (Kefir) for lactase-deficient individuals is usually well tolerated. This is because the healthy bacteria help digest lactose.   Lactose-hydrolyzed milk (Lactaid) contains 40-90% less lactose than milk and may also be well tolerated.    SPECIAL NOTES  Lactose is a carbohydrates. The major food source is dairy products. Reading food labels is important. Many products contain lactose even when they are not made from milk. Look for the following words: whey, milk solids, dry milk solids, nonfat dry milk powder. Typical sources of lactose other than dairy products include breads, candies, cold cuts, prepared and processed foods, and commercial sauces and gravies.   All foods must be prepared without milk, cream, or other dairy foods.   Soy milk and lactose-free supplements (LACTASE) may be used as an alternative to milk.   FOOD GROUP ALLOWED/RECOMMENDED AVOID/USE SPARINGLY  BREADS / STARCHES 4 servings or more* Breads and rolls made without milk. [place]French, [place], or [place] bread. Breads and rolls that contain milk. Prepared mixes such as muffins, biscuits,  waffles, pancakes. Sweet rolls, donuts, [place] toast (if made with milk or lactose).  Crackers: Soda crackers, graham crackers. Any crackers prepared without lactose. Zwieback crackers, corn curls, or any that contain lactose.  Cereals: Cooked or dry cereals prepared without lactose (read labels). Cooked or dry cereals prepared with lactose (read labels). Total, Cocoa Krispies. Special K.  Potatoes / Pasta / Rice: Any prepared without milk or lactose. Popcorn. Instant potatoes, frozen JamaicaFrench fries, scalloped or au gratin potatoes.  VEGETABLES 2 servings or more Fresh, frozen, and canned vegetables. Creamed or breaded vegetables. Vegetables in a cheese sauce or with lactose-containing margarines.  FRUIT 2 servings or more All fresh, canned, or frozen fruits that are not processed with lactose. Any canned or frozen fruits processed with lactose.  MEAT & SUBSTITUTES 2 servings or more (4 to 6 oz. total per day) Plain beef, chicken, fish, Malaw[place]urkey, lamb, veal, pork, or ham. Kosher prepared meat products. Strained or junior meats that do not contain milk. Eggs, soy meat substitutes, nuts. Scrambled eggs, omelets, and souffles that contain milk. Creamed or breaded meat, fish, or fowl. Sausage products such as wieners, liver sausage, or cold cuts that contain milk solids. Cheese, cottage cheese, or cheese spreads.  MILK None. (See "BEVERAGES" for milk substitutes. See "DESSERTS" for ice cream and frozen desserts.) Milk (whole, 2%, skim, or chocolate). Evaporated, powdered, or condensed milk; malted milk.  SOUPS & COMBINATION FOODS Bouillon, broth, vegetable soups, clear soups, consomms. Homemade soups made with allowed ingredients. Combination or prepared foods that do not contain milk or milk  products (read labels). Cream soups, chowders, commercially prepared soups containing lactose. Macaroni and cheese, pizza. Combination or prepared foods that contain milk or milk products.  DESSERTS & SWEETS In  moderation Water and fruit ices; gelatin; angel food cake. Homemade cookies, pies, or cakes made from allowed ingredients. Pudding (if made with water or a milk substitute). Lactose-free tofu desserts. Sugar, honey, corn syrup, jam, jelly; marmalade; molasses (beet sugar); Pure sugar candy; marshmallows. Ice cream, ice milk, sherbet, custard, pudding, frozen yogurt. Commercial cake and cookie mixes. Desserts that contain chocolate. Pie crust made with milk-containing margarine; reduced-calorie desserts made with a sugar substitute that contains lactose. Toffee, peppermint, butterscotch, chocolate, caramels.  FATS & OILS In moderation Butter (as tolerated; contains very small amounts of lactose). Margarines and dressings that do not contain milk, Vegetable oils, shortening, Miracle Whip, mayonnaise, nondairy cream & whipped toppings without lactose or milk solids added (examples: Coffee Rich, Carnation Coffeemate, Rich's Whipped Topping, PolyRich). Tomasa Blase. Margarines and salad dressings containing milk; cream, cream cheese; peanut butter with added milk solids, sour cream, chip dips, made with sour cream.  BEVERAGES Carbonated drinks; tea; coffee and freeze-dried coffee; some instant coffees (check labels). Fruit drinks; fruit and vegetable juice; Rice or Soy milk. Ovaltine, hot chocolate. Some cocoas; some instant coffees; instant iced teas; powdered fruit drinks (read labels).   CONDIMENTS / MISCELLANEOUS Soy sauce, carob powder, olives, gravy made with water, baker's cocoa, pickles, pure seasonings and spices, wine, pure monosodium glutamate, catsup, mustard. Some chewing gums, chocolate, some cocoas. Certain antibiotics and vitamin / mineral preparations. Spice blends if they contain milk products. MSG extender. Artificial sweeteners that contain lactose such as Equal (Nutra-Sweet) and Sweet 'n Low. Some nondairy creamers (read labels).   SAMPLE MENU*  Breakfast   Orange Juice.  Banana.    Bran flakes.   Nondairy Creamer.  Vienna Bread (toasted).   Butter or milk-free margarine.   Coffee or tea.    Noon Meal   Chicken Breast.  Rice.   Green beans.   Butter or milk-free margarine.  Fresh melon.   Coffee or tea.    Evening Meal   Roast Beef.  Baked potato.   Butter or milk-free margarine.   Broccoli.   Lettuce salad with vinegar and oil dressing.  [employer].   Coffee or tea.

## 2016-07-27 NOTE — Progress Notes (Signed)
cc'ed to pcp °

## 2016-07-27 NOTE — Assessment & Plan Note (Addendum)
Chronic symptoms, and SYMPTOMS NOT CONTROLLED. DIFFERENTIAL DIAGNOSIS INCLUDES: PANCREATIC INSUFFICIENCY, MICROSCOPIC COLITIS, SIBO, LESS LIKELY CELIAC SPRUE, GIARDIASIS, C DIFF COLITIS, OR IBD.   SUBMIT STOOL STUDIES. MAY NEED 24 HR STOOL COLLECTION THEN EGD/TCS AND/OR HBT FOR SIBO. Complete labs. After stool and labs submitted, START CREON 3 WITH MEALS AND 2 WITH SNACKS. FOLLOW A DAIRY FREE DIET.  HANDOUT GIVEN. FOLLOW UP IN 2 MOS.

## 2016-07-27 NOTE — Progress Notes (Signed)
Follow up ov made

## 2016-07-27 NOTE — Progress Notes (Addendum)
   Subjective:    Patient ID: Kayla [name], female    DOB: 08/08/1961, 55 y.o.   MRN: [id number]  Selinda FlavinHOWARD, [name], MD  HPI CHANGE IN BOWEL HABITS SINCE 2013. Having trouble with weigh loss and diarrhea since 2013. EXTENSIVE GI WORKUP. Continuing  To lose weight. LAST TSH: 2017. HEARTBURN: CONTROLLED. RARE NAUSEA/VOMITING(NO BLOOD IN STOOL OR VOMIT). NO IBUPROFEN SINCE MOS. NO CHECK FOR CELIAC SPRUE. WATERY STOOLS: 4-5 DAYS A WEEK. NOCTURNAL STOOL 3-4 TIMES A WEEK. RARE #4 STOOL HAS HEMORRHOID PROBLEM IF CONSTIPATED(1-2X/MO, URGE TO GO BUT NOTHING COMES OUT BUT WHEN MOVES BOWELS AFTER MG CITRATE SOLID THEN WATERY). USE TYLENOL FOR BACK PAIN. HAD A SORE LEFT LEG/MILD REDNESS SINCE SAT.  PT DENIES FEVER, CHILLS, HEMATOCHEZIA, HEMATEMESIS, melena, CHEST PAIN, SHORTNESS OF BREATH, abdominal pain, problems swallowing, problems with sedation, heartburn or indigestion.   Past medical history: 1. Pancreatitis 2. ETOH ABUSE, NONE SINCE 1997  PAST SURGICAL HISTORY: 1. HYSTERECTOMY DUE TO BLEEDING 2. CHOLECYSTECTOMY DUE TO ABDOMINAL PAIN, NO GALLSTONES.   Allergies  Allergen Reactions  . Codeine Nausea And Vomiting  . Tramadol Nausea And Vomiting    Current Outpatient Prescriptions  Medication Sig Dispense Refill  . esomeprazole (NEXIUM) 40 MG capsule Take 40 mg by mouth daily.    Marland Kitchen. levothyroxine (SYNTHROID, LEVOTHROID) 50 MCG tablet Take 50 mcg by mouth daily.    Marland Kitchen. olmesartan (BENICAR) 20 MG tablet Take 40 mg by mouth daily.    Marland Kitchen. venlafaxine XR (EFFEXOR-XR) 150 MG 24 hr capsule Take 150 mg by mouth daily.    Marland Kitchen. zolpidem (AMBIEN) 10 MG tablet Take 10 mg by mouth at bedtime.     Family history: no colon cancer/polyp  Social History   Social History  . Marital status: Divorced    Spouse name: N/A  . Number of children: N/A  . Years of education: N/A   Occupational History  . Not on file.   Social History Main Topics  . Smoking status: Never Smoker  . Smokeless tobacco: Never Used    . Alcohol use Not on file  . Drug use: Unknown  . Sexual activity: Not on file   Review of Systems PER HPI OTHERWISE ALL SYSTEMS ARE NEGATIVE.    Objective:   Physical Exam  Constitutional: She is oriented to person, place, and time. She appears well-developed and well-nourished. No distress.  HENT:  Head: Normocephalic and atraumatic.  Mouth/Throat: Oropharynx is clear and moist. No oropharyngeal exudate.  Eyes: Pupils are equal, round, and reactive to light. No scleral icterus.  Neck: Normal range of motion. Neck supple.  Cardiovascular: Normal rate, regular rhythm and normal heart sounds.   Pulmonary/Chest: Effort normal and breath sounds normal. No respiratory distress.  Abdominal: Soft. Bowel sounds are normal. She exhibits no distension. There is no tenderness.  Musculoskeletal: She exhibits no edema.  Lymphadenopathy:    She has no cervical adenopathy.  Neurological: She is alert and oriented to person, place, and time.  NO FOCAL DEFICITS  Skin:  MILD TTP ERYTHEMATOUS MACULAR AREA ON LEFT  ANTERIOR ANKLE/LOWER LEG, NONTENDER  Psychiatric: She has a normal mood and affect.  Vitals reviewed.     Assessment & Plan:

## 2016-07-27 NOTE — Addendum Note (Signed)
Addended by: West BaliFIELDS, Jakiya Bookbinder L on: 07/27/2016 02:51 PM   Modules accepted: Orders

## 2016-07-30 LAB — CLOSTRIDIUM DIFFICILE BY PCR: CDIFFPCR: NOT DETECTED

## 2016-08-01 LAB — TISSUE TRANSGLUTAMINASE, IGA: Tissue Transglutaminase Ab, IgA: 1 U/mL (ref <4)

## 2016-08-01 LAB — GIARDIA ANTIGEN

## 2016-08-01 LAB — FECAL LACTOFERRIN, QUANT: LACTOFERRIN: POSITIVE

## 2016-08-04 DIAGNOSIS — Z6820 Body mass index (BMI) 20.0-20.9, adult: Secondary | ICD-10-CM | POA: Diagnosis not present

## 2016-08-04 DIAGNOSIS — S92515A Nondisplaced fracture of proximal phalanx of left lesser toe(s), initial encounter for closed fracture: Secondary | ICD-10-CM | POA: Diagnosis not present

## 2016-08-05 LAB — PANCREATIC ELASTASE, FECAL: Pancreatic Elastase-1, Stool: 329 mcg/g

## 2016-08-11 ENCOUNTER — Telehealth: Payer: Self-pay | Admitting: Gastroenterology

## 2016-08-11 NOTE — Telephone Encounter (Signed)
RESULTS SENT VIA MY CHART. CONTINUE CREON. CONSIDER HBT FOR SIBO IF DIARRHEA NOT IMPROVED. MAY NEED VITAMIN D LEVELS CHECKED AS WELL. CONSIDER VITAMIN DEAK SUPPLEMENTATION.

## 2016-08-24 DIAGNOSIS — E7800 Pure hypercholesterolemia, unspecified: Secondary | ICD-10-CM | POA: Diagnosis not present

## 2016-08-24 DIAGNOSIS — R5383 Other fatigue: Secondary | ICD-10-CM | POA: Diagnosis not present

## 2016-08-24 DIAGNOSIS — E038 Other specified hypothyroidism: Secondary | ICD-10-CM | POA: Diagnosis not present

## 2016-08-24 DIAGNOSIS — K219 Gastro-esophageal reflux disease without esophagitis: Secondary | ICD-10-CM | POA: Diagnosis not present

## 2016-08-24 DIAGNOSIS — I10 Essential (primary) hypertension: Secondary | ICD-10-CM | POA: Diagnosis not present

## 2016-08-24 DIAGNOSIS — E1165 Type 2 diabetes mellitus with hyperglycemia: Secondary | ICD-10-CM | POA: Diagnosis not present

## 2016-08-29 DIAGNOSIS — G4700 Insomnia, unspecified: Secondary | ICD-10-CM | POA: Diagnosis not present

## 2016-08-29 DIAGNOSIS — K219 Gastro-esophageal reflux disease without esophagitis: Secondary | ICD-10-CM | POA: Diagnosis not present

## 2016-08-29 DIAGNOSIS — Z23 Encounter for immunization: Secondary | ICD-10-CM | POA: Diagnosis not present

## 2016-08-29 DIAGNOSIS — R5383 Other fatigue: Secondary | ICD-10-CM | POA: Diagnosis not present

## 2016-08-29 DIAGNOSIS — I10 Essential (primary) hypertension: Secondary | ICD-10-CM | POA: Diagnosis not present

## 2016-08-29 DIAGNOSIS — E1165 Type 2 diabetes mellitus with hyperglycemia: Secondary | ICD-10-CM | POA: Diagnosis not present

## 2016-09-21 ENCOUNTER — Encounter: Payer: Self-pay | Admitting: Gastroenterology

## 2016-09-21 ENCOUNTER — Ambulatory Visit (INDEPENDENT_AMBULATORY_CARE_PROVIDER_SITE_OTHER): Payer: Commercial Managed Care - HMO | Admitting: Gastroenterology

## 2016-09-21 DIAGNOSIS — K909 Intestinal malabsorption, unspecified: Secondary | ICD-10-CM | POA: Diagnosis not present

## 2016-09-21 DIAGNOSIS — R197 Diarrhea, unspecified: Secondary | ICD-10-CM

## 2016-09-21 DIAGNOSIS — K860 Alcohol-induced chronic pancreatitis: Secondary | ICD-10-CM | POA: Diagnosis not present

## 2016-09-21 NOTE — Patient Instructions (Signed)
TAKE 2 CREON WITH MEALS INSTEAD OF 3.  ADD LACTASE 3 PILLS WHEN YOU CONSUME DAIRY PRODUCTS. USE AT EATS THREE TIMES A DAY.  USE IMODIUM IF NEEDED TO REDUCE DIARRHEA.  CONTINUE TO MONITOR YOUR WEIGHT. ADD BOOST OR CARNATION INSTANT BREAKFAST WITH SOY MILK 3 CANS DAILY FOR 2 MOS.  FOLLOW UP IN 3 MOS.

## 2016-09-21 NOTE — Progress Notes (Signed)
   Subjective:    Patient ID: Kayla SpangleAnnie D [name], female    DOB: 05/09/1961, 55 y.o.   MRN: [id number]  Selinda FlavinHOWARD, [name], MD  HPI PT CONTINUES WITH DIARRHEA. NOT FOLLOWING DAIRY FREE DIET. TAKING CREON. WEIGHT STABLE BUT WANTS TO GAIN WEIGHT. LOST WEIGHT AFTER DIVORCE IN 2013-2014. WAS 220 LBS -->JUN 2017 140 LBS AND NOW 119 LBS(STABLE SINCE OCT 2017).   PT DENIES FEVER, CHILLS, HEMATOCHEZIA, HEMATEMESIS, nausea, vomiting, melena, CHEST PAIN, SHORTNESS OF BREATH,  CHANGE IN BOWEL IN HABITS, constipation, abdominal pain, problems swallowing, OR heartburn or indigestion.  Past medical history: 1. Pancreatitis 2. ETOH ABUSE, NONE SINCE 1997  PAST SURGICAL HISTORY: 1. HYSTERECTOMY DUE TO BLEEDING 2. CHOLECYSTECTOMY DUE TO ABDOMINAL PAIN, NO GALLSTONES.  Allergies  Allergen Reactions  . Codeine Nausea And Vomiting  . Tramadol Nausea And Vomiting    Current Outpatient Prescriptions  Medication Sig Dispense Refill  . esomeprazole (NEXIUM) 40 MG capsule Take 40 mg by mouth daily.    Marland Kitchen. levothyroxine (SYNTHROID, LEVOTHROID) 50 MCG tablet Take 50 mcg by mouth daily.    . lipase/protease/amylase (CREON) 36000 UNITS CPEP capsule Take 3 capsules (108,000 Units total) by mouth 3 (three) times daily with meals. 2 PO WITH MEALS    . olmesartan (BENICAR) 20 MG tablet Take 40 mg by mouth daily.    Marland Kitchen. venlafaxine XR (EFFEXOR-XR) 150 MG 24 hr capsule Take 150 mg by mouth daily.    Marland Kitchen. zolpidem (AMBIEN) 10 MG tablet Take 10 mg by mouth at bedtime.     Review of Systems  PER HPI OTHERWISE ALL SYSTEMS ARE NEGATIVE.    Objective:   Physical Exam  Constitutional: She is oriented to person, place, and time. She appears well-developed and well-nourished. No distress.  HENT:  Head: Normocephalic and atraumatic.  Mouth/Throat: Oropharynx is clear and moist. No oropharyngeal exudate.  Eyes: Pupils are equal, round, and reactive to light. No scleral icterus.  Neck: Normal range of motion. Neck supple.    Cardiovascular: Normal rate, regular rhythm and normal heart sounds.   Pulmonary/Chest: Effort normal and breath sounds normal. No respiratory distress.  Abdominal: Soft. Bowel sounds are normal. She exhibits no distension. There is no tenderness.  Musculoskeletal: She exhibits no edema.  Lymphadenopathy:    She has no cervical adenopathy.  Neurological: She is alert and oriented to person, place, and time.  NO FOCAL DEFICITS  Psychiatric: She has a normal mood and affect.  Vitals reviewed.     Assessment & Plan:

## 2016-09-21 NOTE — Progress Notes (Signed)
ON RECALL  °

## 2016-09-22 NOTE — Progress Notes (Signed)
cc'ed to pcp °

## 2016-09-24 ENCOUNTER — Encounter: Payer: Self-pay | Admitting: Gastroenterology

## 2016-09-24 DIAGNOSIS — K852 Alcohol induced acute pancreatitis without necrosis or infection: Secondary | ICD-10-CM | POA: Insufficient documentation

## 2016-09-24 NOTE — Assessment & Plan Note (Addendum)
CONTINUES TO ABSTAIN. RECORDS REVIEWED FROM MAR 2017 TO PRESENT. SYMPTOMS FAIRLY WELL CONTROLLED.  NEEDS REPEAT MRCP AND/OR EUS IN JAN 2018.

## 2016-09-24 NOTE — Assessment & Plan Note (Signed)
SYMPTOMS NOT IDEALLY CONTROLLED.   TAKE 2 CREON WITH MEALS INSTEAD OF 3. ADD LACTASE 3 PILLS WHEN YOU CONSUME DAIRY PRODUCTS. USE AT EATS THREE TIMES A DAY. USE IMODIUM IF NEEDED TO REDUCE DIARRHEA. CONTINUE TO MONITOR YOUR WEIGHT. ADD BOOST OR CARNATION INSTANT BREAKFAST WITH SOY MILK 3 CANS DAILY FOR 2 MOS.  FOLLOW UP IN 3 MOS.

## 2016-09-28 ENCOUNTER — Telehealth: Payer: Self-pay

## 2016-09-28 ENCOUNTER — Other Ambulatory Visit: Payer: Self-pay

## 2016-09-28 DIAGNOSIS — K869 Disease of pancreas, unspecified: Secondary | ICD-10-CM

## 2016-09-28 NOTE — Telephone Encounter (Signed)
PA Suspended for MRI/MRCP abdomen. Outpatient Authorization [id number].

## 2016-09-28 NOTE — Progress Notes (Signed)
cc'd to pcp 

## 2016-09-28 NOTE — Progress Notes (Signed)
MRI/MRCP scheduled for 10/05/16 at 10:00 am, arrive at 9:45 am. NPO 4 hours prior to test. Called and informed pt. Instructions given and letter mailed.

## 2016-10-05 ENCOUNTER — Ambulatory Visit (HOSPITAL_COMMUNITY): Payer: Medicare HMO

## 2016-10-17 ENCOUNTER — Ambulatory Visit (HOSPITAL_COMMUNITY): Payer: Medicare HMO

## 2016-10-21 ENCOUNTER — Telehealth: Payer: Self-pay | Admitting: *Deleted

## 2016-10-24 ENCOUNTER — Ambulatory Visit (HOSPITAL_COMMUNITY): Payer: Medicare HMO

## 2016-11-03 ENCOUNTER — Encounter: Payer: Self-pay | Admitting: Gastroenterology

## 2016-11-07 ENCOUNTER — Ambulatory Visit (HOSPITAL_COMMUNITY): Payer: Medicare HMO

## 2016-11-08 NOTE — Telephone Encounter (Signed)
Her insurance is pending for the MRCP. The tracking number is [id number]. I faxed additional information today

## 2016-11-08 NOTE — Telephone Encounter (Signed)
PLEASE CALL PT. SHE NEEDS TO SCHEDULE MRI/MRCP, Dx: DILATED PANCREAS DUCT, EVALUATE FOR MASS IN HEAD OF PANCREAS.

## 2016-11-10 ENCOUNTER — Encounter: Payer: Self-pay | Admitting: Gastroenterology

## 2016-11-14 ENCOUNTER — Telehealth: Payer: Self-pay

## 2016-11-14 NOTE — Telephone Encounter (Signed)
According to Epic appts, pt is scheduled for MRI 11/15/16 at 11:00am.

## 2016-11-14 NOTE — Telephone Encounter (Signed)
PA# for MRI: [id number]. Confirmation date: 11/03/16-12/03/16.

## 2016-11-14 NOTE — Telephone Encounter (Signed)
PA#  [id number]- for MRI good until 12/03/16

## 2016-11-15 ENCOUNTER — Other Ambulatory Visit: Payer: Self-pay | Admitting: Gastroenterology

## 2016-11-15 ENCOUNTER — Ambulatory Visit (HOSPITAL_COMMUNITY)
Admission: RE | Admit: 2016-11-15 | Discharge: 2016-11-15 | Disposition: A | Discharge status: Home / Self Care | Payer: Medicare HMO | Source: Ambulatory Visit | Attending: Gastroenterology | Admitting: Gastroenterology

## 2016-11-15 ENCOUNTER — Encounter (HOSPITAL_COMMUNITY): Payer: Self-pay | Admitting: Radiology

## 2016-11-15 DIAGNOSIS — K869 Disease of pancreas, unspecified: Secondary | ICD-10-CM | POA: Insufficient documentation

## 2016-11-15 DIAGNOSIS — K760 Fatty (change of) liver, not elsewhere classified: Secondary | ICD-10-CM | POA: Insufficient documentation

## 2016-11-15 LAB — POCT I-STAT CREATININE: Creatinine, Ser: 0.6 mg/dL (ref 0.44–1.00)

## 2016-11-15 MED ORDER — GADOBENATE DIMEGLUMINE 529 MG/ML IV SOLN
10.0000 mL | Freq: Once | INTRAVENOUS | Status: AC | PRN
  Administered 2016-11-15: 10 mL via INTRAVENOUS

## 2016-11-16 ENCOUNTER — Other Ambulatory Visit: Payer: Self-pay

## 2016-11-16 ENCOUNTER — Telehealth: Payer: Self-pay | Admitting: Gastroenterology

## 2016-11-16 DIAGNOSIS — K859 Acute pancreatitis without necrosis or infection, unspecified: Secondary | ICD-10-CM

## 2016-11-16 NOTE — Telephone Encounter (Signed)
Referral has been made.

## 2016-11-16 NOTE — Telephone Encounter (Signed)
Notes Recorded by West BaliSandi L Fields, MD on 11/15/2016 at 10:41 PM EST MULTIPLE STONES IN YOUR PANCREAS DUCT.REFER TO [facility] TO SEE WITH A GI DOCTOR WHO SPECIALIZES IN THE PANCREAS

## 2016-11-23 DIAGNOSIS — E7800 Pure hypercholesterolemia, unspecified: Secondary | ICD-10-CM | POA: Diagnosis not present

## 2016-11-23 DIAGNOSIS — I10 Essential (primary) hypertension: Secondary | ICD-10-CM | POA: Diagnosis not present

## 2016-11-23 DIAGNOSIS — E876 Hypokalemia: Secondary | ICD-10-CM | POA: Diagnosis not present

## 2016-11-23 DIAGNOSIS — K219 Gastro-esophageal reflux disease without esophagitis: Secondary | ICD-10-CM | POA: Diagnosis not present

## 2016-11-23 DIAGNOSIS — E038 Other specified hypothyroidism: Secondary | ICD-10-CM | POA: Diagnosis not present

## 2016-11-23 DIAGNOSIS — E1165 Type 2 diabetes mellitus with hyperglycemia: Secondary | ICD-10-CM | POA: Diagnosis not present

## 2016-11-29 NOTE — Telephone Encounter (Signed)
Called [facility] to f/u on referral. Pt has appt with Dr. Lorenda PeckWeinberg 02/10/17 at 10:00am. Called pt and she was already aware.

## 2016-11-30 DIAGNOSIS — K219 Gastro-esophageal reflux disease without esophagitis: Secondary | ICD-10-CM | POA: Diagnosis not present

## 2016-11-30 DIAGNOSIS — Z681 Body mass index (BMI) 19 or less, adult: Secondary | ICD-10-CM | POA: Diagnosis not present

## 2016-11-30 DIAGNOSIS — R634 Abnormal weight loss: Secondary | ICD-10-CM | POA: Diagnosis not present

## 2016-11-30 DIAGNOSIS — F330 Major depressive disorder, recurrent, mild: Secondary | ICD-10-CM | POA: Diagnosis not present

## 2017-01-25 DIAGNOSIS — K8689 Other specified diseases of pancreas: Secondary | ICD-10-CM | POA: Diagnosis not present

## 2017-01-25 DIAGNOSIS — Z9049 Acquired absence of other specified parts of digestive tract: Secondary | ICD-10-CM | POA: Diagnosis not present

## 2017-01-25 DIAGNOSIS — R911 Solitary pulmonary nodule: Secondary | ICD-10-CM | POA: Diagnosis not present

## 2017-03-20 DIAGNOSIS — E1165 Type 2 diabetes mellitus with hyperglycemia: Secondary | ICD-10-CM | POA: Diagnosis not present

## 2017-03-20 DIAGNOSIS — E7800 Pure hypercholesterolemia, unspecified: Secondary | ICD-10-CM | POA: Diagnosis not present

## 2017-03-20 DIAGNOSIS — E038 Other specified hypothyroidism: Secondary | ICD-10-CM | POA: Diagnosis not present

## 2017-03-20 DIAGNOSIS — I10 Essential (primary) hypertension: Secondary | ICD-10-CM | POA: Diagnosis not present

## 2017-03-20 DIAGNOSIS — R5383 Other fatigue: Secondary | ICD-10-CM | POA: Diagnosis not present

## 2017-03-20 DIAGNOSIS — K219 Gastro-esophageal reflux disease without esophagitis: Secondary | ICD-10-CM | POA: Diagnosis not present

## 2017-03-20 DIAGNOSIS — E876 Hypokalemia: Secondary | ICD-10-CM | POA: Diagnosis not present

## 2017-03-23 DIAGNOSIS — R634 Abnormal weight loss: Secondary | ICD-10-CM | POA: Diagnosis not present

## 2017-03-23 DIAGNOSIS — F330 Major depressive disorder, recurrent, mild: Secondary | ICD-10-CM | POA: Diagnosis not present

## 2017-03-23 DIAGNOSIS — E1165 Type 2 diabetes mellitus with hyperglycemia: Secondary | ICD-10-CM | POA: Diagnosis not present

## 2017-03-23 DIAGNOSIS — Z1389 Encounter for screening for other disorder: Secondary | ICD-10-CM | POA: Diagnosis not present

## 2017-03-23 DIAGNOSIS — E038 Other specified hypothyroidism: Secondary | ICD-10-CM | POA: Diagnosis not present

## 2017-03-23 DIAGNOSIS — Z6820 Body mass index (BMI) 20.0-20.9, adult: Secondary | ICD-10-CM | POA: Diagnosis not present

## 2017-03-23 DIAGNOSIS — I10 Essential (primary) hypertension: Secondary | ICD-10-CM | POA: Diagnosis not present

## 2017-04-28 DIAGNOSIS — Q453 Other congenital malformations of pancreas and pancreatic duct: Secondary | ICD-10-CM | POA: Diagnosis not present

## 2017-04-28 DIAGNOSIS — K861 Other chronic pancreatitis: Secondary | ICD-10-CM | POA: Diagnosis not present

## 2017-06-29 DIAGNOSIS — E038 Other specified hypothyroidism: Secondary | ICD-10-CM | POA: Diagnosis not present

## 2017-06-29 DIAGNOSIS — E1165 Type 2 diabetes mellitus with hyperglycemia: Secondary | ICD-10-CM | POA: Diagnosis not present

## 2017-06-29 DIAGNOSIS — Z0000 Encounter for general adult medical examination without abnormal findings: Secondary | ICD-10-CM | POA: Diagnosis not present

## 2017-06-29 DIAGNOSIS — I10 Essential (primary) hypertension: Secondary | ICD-10-CM | POA: Diagnosis not present

## 2017-06-29 DIAGNOSIS — F330 Major depressive disorder, recurrent, mild: Secondary | ICD-10-CM | POA: Diagnosis not present

## 2017-06-29 DIAGNOSIS — Z6826 Body mass index (BMI) 26.0-26.9, adult: Secondary | ICD-10-CM | POA: Diagnosis not present

## 2017-06-29 DIAGNOSIS — Z23 Encounter for immunization: Secondary | ICD-10-CM | POA: Diagnosis not present

## 2017-07-11 DIAGNOSIS — G44219 Episodic tension-type headache, not intractable: Secondary | ICD-10-CM | POA: Diagnosis not present

## 2017-07-11 DIAGNOSIS — H40033 Anatomical narrow angle, bilateral: Secondary | ICD-10-CM | POA: Diagnosis not present

## 2017-08-10 DIAGNOSIS — Q8909 Congenital malformations of spleen: Secondary | ICD-10-CM | POA: Diagnosis not present

## 2017-08-10 DIAGNOSIS — K8689 Other specified diseases of pancreas: Secondary | ICD-10-CM | POA: Diagnosis not present

## 2017-08-10 DIAGNOSIS — K838 Other specified diseases of biliary tract: Secondary | ICD-10-CM | POA: Diagnosis not present

## 2017-08-10 DIAGNOSIS — Z9049 Acquired absence of other specified parts of digestive tract: Secondary | ICD-10-CM | POA: Diagnosis not present

## 2017-08-16 IMAGING — MR MR ABDOMEN WO/W CM MRCP
6 of 21 series · 16 of 48 positions shown · IV contrast (multihance)
Comparison: MRI 12/29/2015

CLINICAL DATA: Pancreatic lesion.

EXAM:
MRI ABDOMEN WITHOUT AND WITH CONTRAST (INCLUDING MRCP)
TECHNIQUE: Multiplanar multisequence MR imaging of the abdomen was performed
both before and after the administration of intravenous contrast.
Heavily T2-weighted images of the biliary and pancreatic ducts were
obtained, and three-dimensional MRCP images were rendered by post
processing.
CONTRAST:  10mL MULTIHANCE GADOBENATE DIMEGLUMINE 529 MG/ML IV SOLN

[Series 6: t2fs axial · axial · 5.0mm · 0.99mm/px · z∈[-53,+180]mm · 2 of 40 slices shown]
[im 1/40]
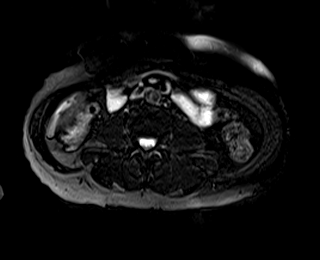
[im 40/40]
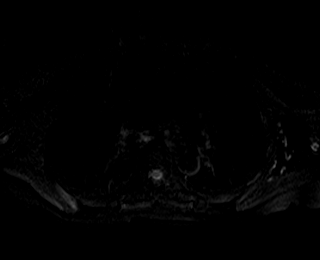

[Series 7: bSSFP · axial · 4.0mm · 1.14mm/px · z∈[-55,+181]mm · 3 of 60 slices shown]
[im 1/60]
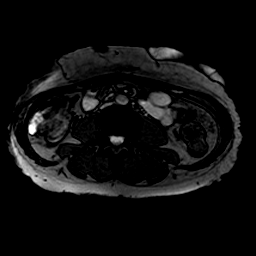
[im 30/60]
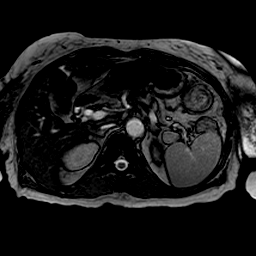
[im 60/60]
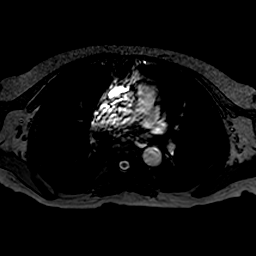

[Series 8: T1 · axial · 4.0mm · 0.49mm/px · z∈[-90,+146]mm · 3 of 60 slices shown (1 of 3)]
[im 1/60]
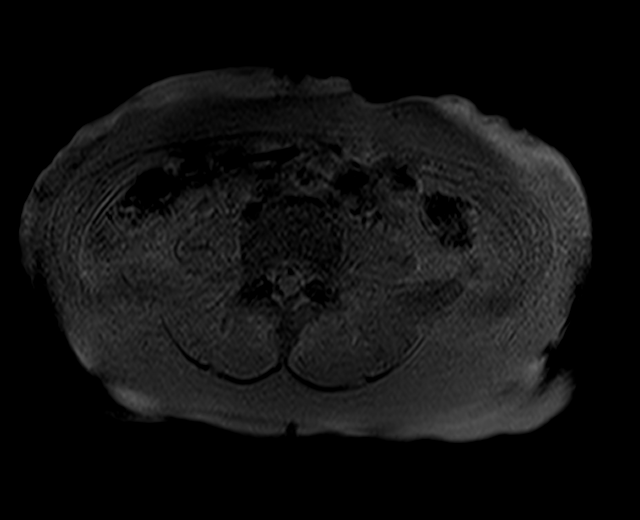
[im 30/60]
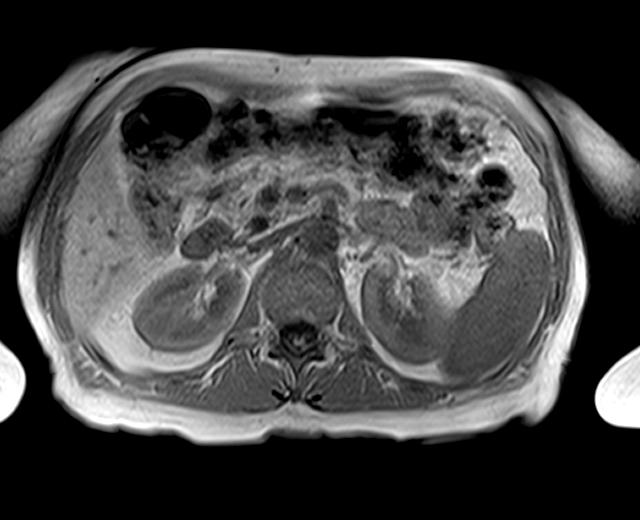
[im 60/60]
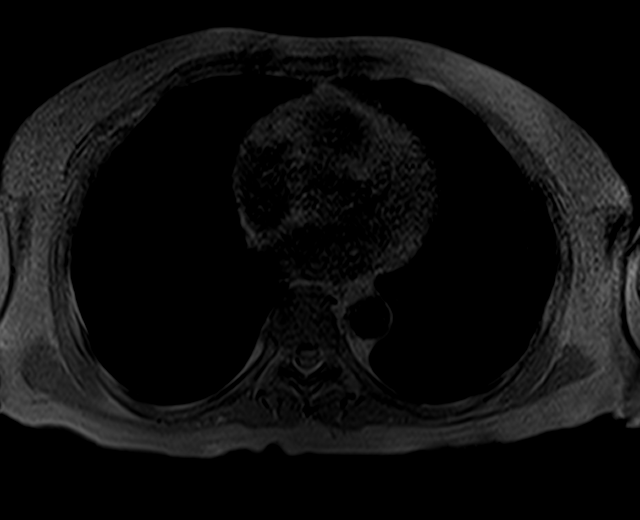

[Series 9: T1 · axial · 4.0mm · 0.49mm/px · z∈[-90,+146]mm · 3 of 60 slices shown (2 of 3)]
[im 1/60]
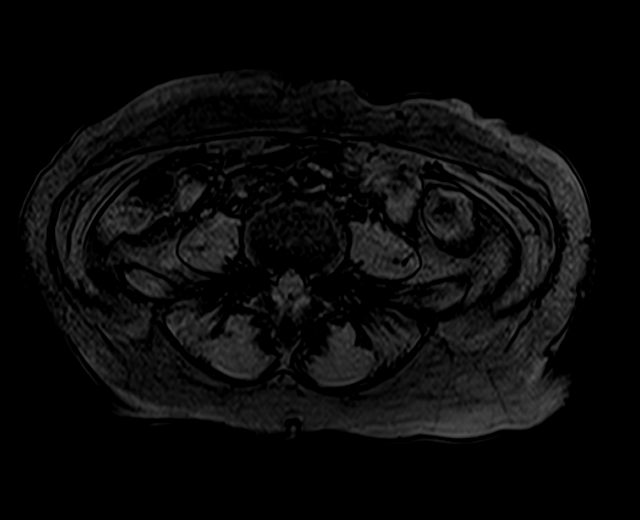
[im 30/60]
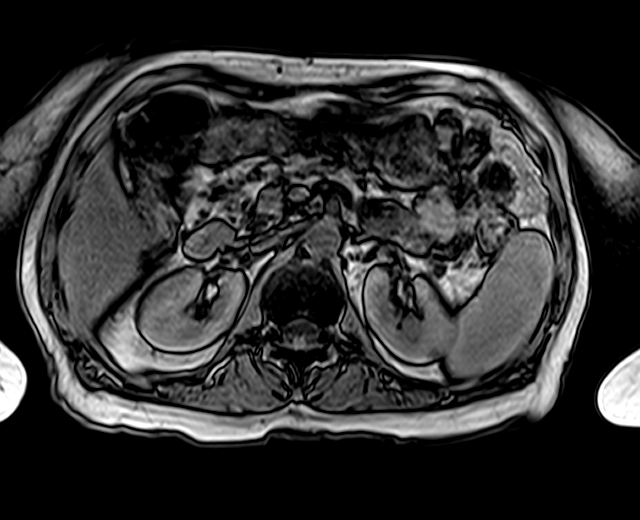
[im 60/60]
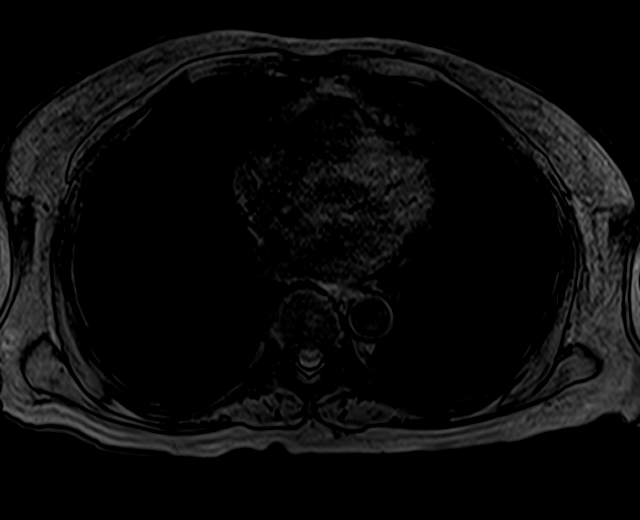

[Series 11: T1 · axial · 4.0mm · 0.49mm/px · z∈[-90,+146]mm · 3 of 60 slices shown (3 of 3)]
[im 1/60]
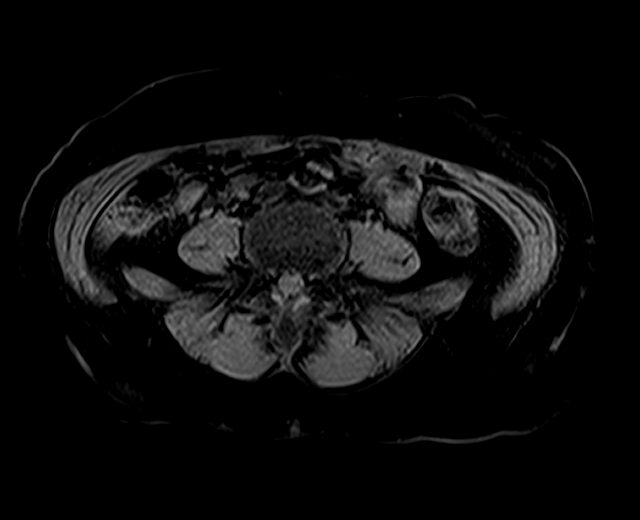
[im 30/60]
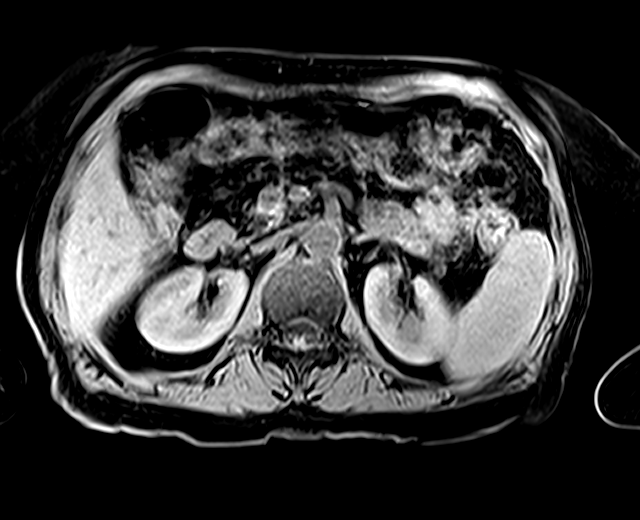
[im 60/60]
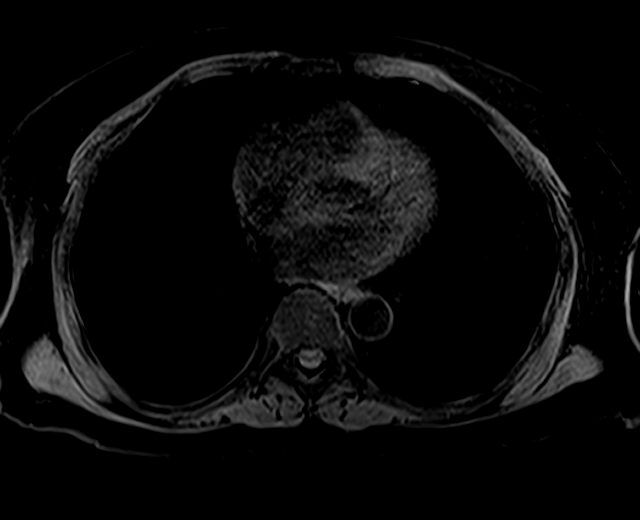

[Series 13: T2 · coronal · 1.0mm · 0.42mm/px · 2 of 144 slices shown]
[im 1/144]
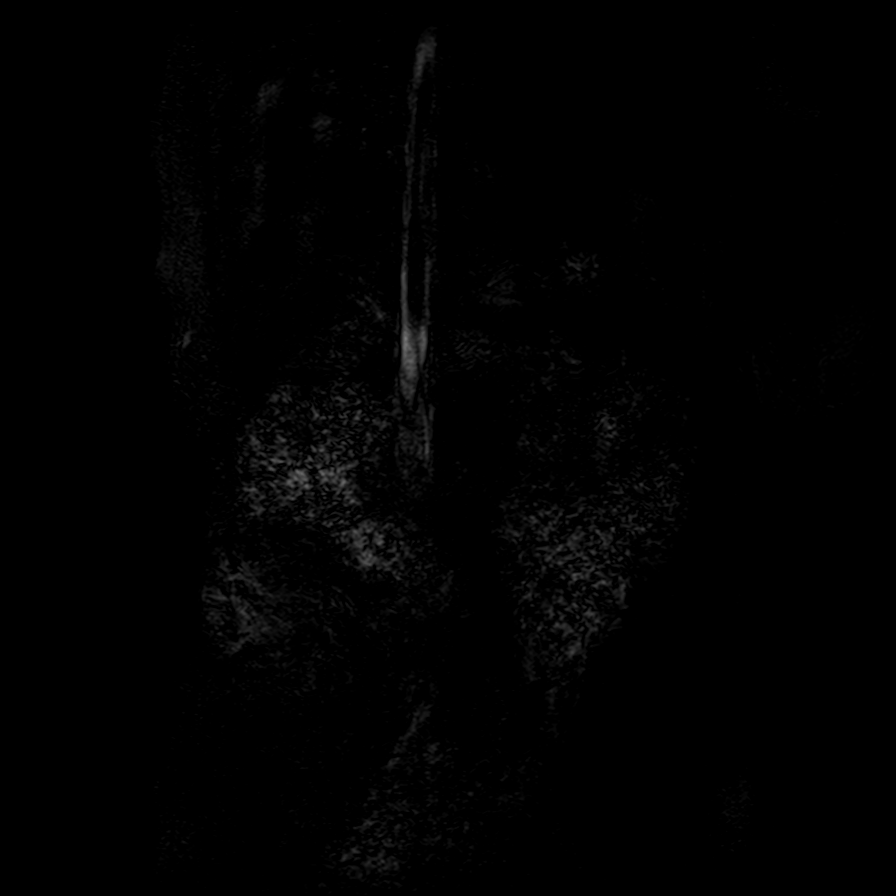
[im 29/144]
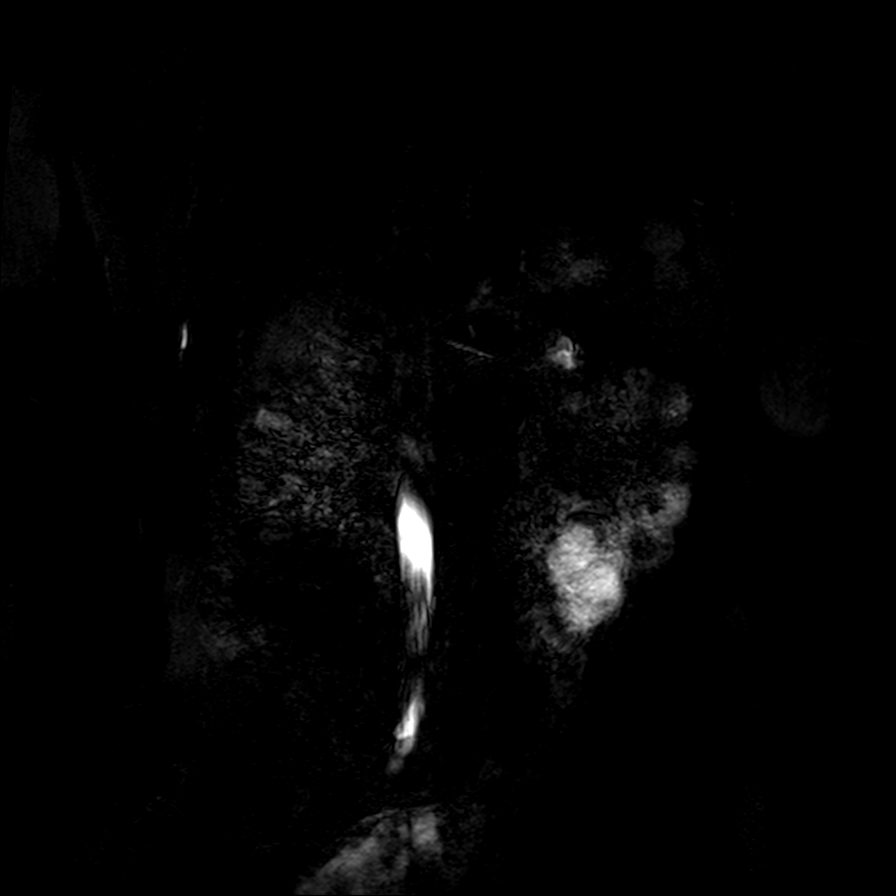

[16 of 48 positions shown; findings below may reference images not displayed]

FINDINGS: Exam is degraded by respiratory motion. The degree of degradation is
worse than comparison MRI.

Lower chest:  Lung bases are clear.

Hepatobiliary: Mild diffuse hepatic steatosis. No biliary duct
dilatation. Postcholecystectomy. There is a the vascular
malformation in the RIGHT hepatic lobe extending from the RIGHT
portal vein to the RIGHT hepatic vein (image 26, series 2999).

The common bile duct is prominent at 7 mm not changed from prior. No
filling defect within the common bile duct.

Pancreas: There is pancreas divisum anatomy. There is stricture of
the distal pancreatic duct leading up to the secondary ampulla.

The pancreatic duct is dilated to 12 mm similar to 11 mm to slightly
progressed. There are however multiple new stones within the
pancreatic duct extending to the tail. These measure approximately 5
mm each in number approximately 10. No mass lesions identified in
the pancreatic head.

Spleen: Normal spleen.

Adrenals/urinary tract: Adrenal glands and kidneys are normal.

Stomach/Bowel: Stomach and limited of the small bowel is
unremarkable

Vascular/Lymphatic: No retroperitoneal lymphadenopathy. Abdominal
aorta normal. Bold

Musculoskeletal: No aggressive osseous lesion
IMPRESSION: 1. Continued progression of pancreatic duct dilatation without
obstructing mass identified. New stones within the pancreatic duct.
Variant ductal anatomy (ductus divisum ). This could predispose to
stricturing. Consider ERCP for evaluation.
2. Common bile duct is mildly dilated similar prior.
3. Hepatic steatosis.

## 2017-10-26 DIAGNOSIS — R634 Abnormal weight loss: Secondary | ICD-10-CM | POA: Diagnosis not present

## 2017-10-26 DIAGNOSIS — R69 Illness, unspecified: Secondary | ICD-10-CM | POA: Diagnosis not present

## 2017-10-26 DIAGNOSIS — K219 Gastro-esophageal reflux disease without esophagitis: Secondary | ICD-10-CM | POA: Diagnosis not present

## 2017-10-26 DIAGNOSIS — E876 Hypokalemia: Secondary | ICD-10-CM | POA: Diagnosis not present

## 2017-10-26 DIAGNOSIS — I10 Essential (primary) hypertension: Secondary | ICD-10-CM | POA: Diagnosis not present

## 2017-10-26 DIAGNOSIS — D649 Anemia, unspecified: Secondary | ICD-10-CM | POA: Diagnosis not present

## 2017-10-26 DIAGNOSIS — E1165 Type 2 diabetes mellitus with hyperglycemia: Secondary | ICD-10-CM | POA: Diagnosis not present

## 2017-10-26 DIAGNOSIS — R5383 Other fatigue: Secondary | ICD-10-CM | POA: Diagnosis not present

## 2017-10-30 DIAGNOSIS — Z6827 Body mass index (BMI) 27.0-27.9, adult: Secondary | ICD-10-CM | POA: Diagnosis not present

## 2017-10-30 DIAGNOSIS — I10 Essential (primary) hypertension: Secondary | ICD-10-CM | POA: Diagnosis not present

## 2017-10-30 DIAGNOSIS — E1165 Type 2 diabetes mellitus with hyperglycemia: Secondary | ICD-10-CM | POA: Diagnosis not present

## 2017-10-30 DIAGNOSIS — K219 Gastro-esophageal reflux disease without esophagitis: Secondary | ICD-10-CM | POA: Diagnosis not present

## 2017-10-30 DIAGNOSIS — D649 Anemia, unspecified: Secondary | ICD-10-CM | POA: Diagnosis not present

## 2017-10-30 DIAGNOSIS — E038 Other specified hypothyroidism: Secondary | ICD-10-CM | POA: Diagnosis not present

## 2017-10-30 DIAGNOSIS — E876 Hypokalemia: Secondary | ICD-10-CM | POA: Diagnosis not present

## 2017-12-25 DIAGNOSIS — Z6829 Body mass index (BMI) 29.0-29.9, adult: Secondary | ICD-10-CM | POA: Diagnosis not present

## 2017-12-25 DIAGNOSIS — J069 Acute upper respiratory infection, unspecified: Secondary | ICD-10-CM | POA: Diagnosis not present

## 2018-01-17 ENCOUNTER — Ambulatory Visit (INDEPENDENT_AMBULATORY_CARE_PROVIDER_SITE_OTHER): Payer: Medicare HMO | Admitting: Gastroenterology

## 2018-01-17 ENCOUNTER — Encounter: Payer: Self-pay | Admitting: Gastroenterology

## 2018-01-17 DIAGNOSIS — Z1211 Encounter for screening for malignant neoplasm of colon: Secondary | ICD-10-CM | POA: Diagnosis not present

## 2018-01-17 DIAGNOSIS — K8689 Other specified diseases of pancreas: Secondary | ICD-10-CM | POA: Insufficient documentation

## 2018-01-17 DIAGNOSIS — K852 Alcohol induced acute pancreatitis without necrosis or infection: Secondary | ICD-10-CM

## 2018-01-17 DIAGNOSIS — R197 Diarrhea, unspecified: Secondary | ICD-10-CM

## 2018-01-17 DIAGNOSIS — R69 Illness, unspecified: Secondary | ICD-10-CM | POA: Diagnosis not present

## 2018-01-17 DIAGNOSIS — K909 Intestinal malabsorption, unspecified: Secondary | ICD-10-CM | POA: Diagnosis not present

## 2018-01-17 MED ORDER — PANCRELIPASE (LIP-PROT-AMYL) 36000-114000 UNITS PO CPEP: ORAL_CAPSULE | ORAL | 11 refills | Status: DC

## 2018-01-17 NOTE — Assessment & Plan Note (Signed)
SYMPTOMS FAIRLY WELL CONTROLLED BUT EXACERBATED BY DAIRY AND HIGH FAT MEAL. DIFFERENTIAL DIAGNOSIS INCLUDES: LACTOSE INTOLERANCE, BILE SALT INDUCED DIARRHEA AND PANCREATIC INSUFFICIENCY.  DRINK WATER TO KEEP YOUR URINE LIGHT YELLOW. WHILE I DO NOT WANT TO ALARM YOU, YOUR BODY MASS INDEX IS ALMOST OVER 30. A BMI OVER 30 MEANS YOU WILL BE  OBESE. OBESITY IS ASSOCIATED WITH AN INCREASE RISK FOR CIRRHOSIS AND ALL CANCERS, INCLUDING PANCREAS, ESOPHAGEAL AND COLON CANCER. A WEIGHT OF 169 LBS OR LESS  WILL KEEP YOUR BODY MASS INDEX(BMI) UNDER 30. TO AVOID DIARRHEA:   1. FOLLOW A LOW FAT DIET.  HANDOUT GIVEN.   2. IF YOU CONSUME DAIRY, ADD LACTASE 2-3 PILLS WITH MEALS UP TO THREE TIMES A DAY.   3. IF YOU EAT A HIGH FAT MEAL, ADD CREON 1-2 PILLS WITH THAT MEAL. FOLLOW UP IN 1-2 YEARS. WE WILL REFILL YOUR CREON UNTIL YOUR NEXT VISIT.  I WILL CONTACT [facility] FOR YOUR COLONOSCOPY REPORT.

## 2018-01-17 NOTE — Assessment & Plan Note (Signed)
CLINICALLY IMPROVED.  [facility]2 [facility]. CALL WITH QUESTIONS OR CONCERNS.

## 2018-01-17 NOTE — Assessment & Plan Note (Signed)
AVERAGE RISK. LAST TCS MAYBE < 10 YRS.  I WILL CONTACT [facility] FOR YOUR COLONOSCOPY REPORT.

## 2018-01-17 NOTE — Assessment & Plan Note (Signed)
NO EXACERBATION SINCE LAST VISIT AND NOT REQUIRING DAILY CREON.  DRINK WATER TO KEEP YOUR URINE LIGHT YELLOW. WHILE I DO NOT WANT TO ALARM YOU, YOUR BODY MASS INDEX IS ALMOST OVER 30. A BMI OVER 30 MEANS YOU WILL BE  OBESE. OBESITY IS ASSOCIATED WITH AN INCREASE RISK FOR CIRRHOSIS AND ALL CANCERS, INCLUDING PANCREAS, ESOPHAGEAL AND COLON CANCER. A WEIGHT OF 169 LBS OR LESS  WILL KEEP YOUR BODY MASS INDEX(BMI) UNDER 30.  TO AVOID DIARRHEA:   1. FOLLOW A LOW FAT DIET.  HANDOUT GIVEN.   3. IF YOU EAT A HIGH FAT MEAL, ADD CREON 1-2 PILLS WITH THAT MEAL.  FOLLOW UP IN 1-2 YEARS. WE WILL REFILL YOUR CREON UNTIL YOUR NEXT VISIT.

## 2018-01-17 NOTE — Patient Instructions (Signed)
DRINK WATER TO KEEP YOUR URINE LIGHT YELLOW.  WHILE I DO NOT WANT TO ALARM YOU, YOUR BODY MASS INDEX IS ALMOST OVER 30. A BMI OVER 30 MEANS YOU WILL BE  OBESE. OBESITY IS ASSOCIATED WITH AN INCREASE RISK FOR CIRRHOSIS AND ALL CANCERS, INCLUDING PANCREAS, ESOPHAGEAL AND COLON CANCER. A WEIGHT OF 169 LBS OR LESS  WILL KEEP YOUR BODY MASS INDEX(BMI) UNDER 30.  TO AVOID DIARRHEA:   1. FOLLOW A LOW FAT DIET. SEE INFO BELOW.    2. IF YOU CONSUME DAIRY, ADD LACTASE 2-3 PILLS WITH MEALS UP TO THREE TIMES A DAY.   3. IF YOU EAT A HIGH FAT MEAL, ADD CREON 1-2 PILLS WITH THAT MEAL.  FOLLOW UP IN 1-2 YEARS. WE WILL REFILL YOUR CREON UNTIL YOUR NEXT VISIT.  I WILL CONTACT [facility] FOR YOUR COLONOSCOPY REPORT.

## 2018-01-17 NOTE — Progress Notes (Signed)
ON RECALL  °

## 2018-01-17 NOTE — Progress Notes (Signed)
   Subjective:    Patient ID: Kayla [name], female    DOB: 07/17/1961, 57 y.o.   MRN: [id number]  Selinda FlavinHoward, [name], MD   HPI ATE CUCUMBER SALAD, COCONUT PIE, AND TACO WITH LETTUCE WITH SOUR CREAM. HAVEN'T BEEN TAKING CREON EVERY DAY. WAS TAKING CREON 36,000. ALSO KNOW TO BE LACTOSE INTOLERANCE. BOWELS USUALLY: THIS AM #7 OTHERWISE #5 OR 6. YESTERDAY FELT BAD & HAD CHILLS. GAINED WEIGHT SINCE LAST VISIT IN DEC 2017. OCCASIONAL BACK PAIN.MAY HAVE CRAMPS IN STOMACH AND THEN EASE OFF:  NOT EVERY WEEK. MAY FEELS LIKE SHE DOESN'T GET ENOUGH AIR ON HER LUNGS.   PT DENIES FEVER, HEMATOCHEZIA, HEMATEMESIS, nausea, vomiting, melena, CHEST PAIN, SHORTNESS OF BREATH, CHANGE IN BOWEL IN HABITS, constipation, problems swallowing, OR heartburn or indigestion.  Past Medical History:  Diagnosis Date  . Alcohol abuse    NONE SINCE 1997  . Alcohol-induced pancreatitis    NO ETOH SINCE 1997   Past Surgical History:  Procedure Laterality Date  . ABDOMINAL HYSTERECTOMY     DUE TO DUB  . CHOLECYSTECTOMY     BILIARY COLIC-NO STONES   Allergies  Allergen Reactions  . Codeine Nausea And Vomiting  . Tramadol Nausea And Vomiting   Current Outpatient Medications  Medication Sig    . escitalopram (LEXAPRO) 10 MG tablet Take 10 mg by mouth daily.    Marland Kitchen. esomeprazole (NEXIUM) 40 MG capsule Take 40 mg by mouth daily.    Marland Kitchen. levothyroxine (SYNTHROID, LEVOTHROID) 50 MCG tablet Take 100 mcg by mouth daily.     Marland Kitchen. olmesartan (BENICAR) 20 MG tablet Take 40 mg by mouth daily.    . potassium chloride (K-DUR,KLOR-CON) 10 MEQ tablet Take 10 mEq by mouth daily.    Marland Kitchen. zolpidem (AMBIEN) 10 MG tablet Take 10 mg by mouth at bedtime.    .      . venlafaxine XR (EFFEXOR-XR) 150 MG 24 hr capsule Take 150 mg by mouth daily.     Review of Systems PER HPI OTHERWISE ALL SYSTEMS ARE NEGATIVE.    Objective:   Physical Exam  Constitutional: She is oriented to person, place, and time. She appears well-developed and well-nourished. No  distress.  HENT:  Head: Normocephalic and atraumatic.  Mouth/Throat: Oropharynx is clear and moist. No oropharyngeal exudate.  Eyes: Pupils are equal, round, and reactive to light. No scleral icterus.  Neck: Normal range of motion. Neck supple.  Cardiovascular: Normal rate, regular rhythm and normal heart sounds.  Pulmonary/Chest: Effort normal and breath sounds normal. No respiratory distress.  Abdominal: Soft. Bowel sounds are normal. She exhibits no distension. There is no tenderness.  Musculoskeletal: She exhibits no edema.  Lymphadenopathy:    She has no cervical adenopathy.  Neurological: She is alert and oriented to person, place, and time.  NO FOCAL DEFICITS  Psychiatric: She has a normal mood and affect.  Vitals reviewed.         Assessment & Plan:

## 2018-01-17 NOTE — Progress Notes (Signed)
CC'ED TO PCP 

## 2018-02-15 DIAGNOSIS — Z9049 Acquired absence of other specified parts of digestive tract: Secondary | ICD-10-CM | POA: Diagnosis not present

## 2018-02-15 DIAGNOSIS — K838 Other specified diseases of biliary tract: Secondary | ICD-10-CM | POA: Diagnosis not present

## 2018-02-15 DIAGNOSIS — R590 Localized enlarged lymph nodes: Secondary | ICD-10-CM | POA: Diagnosis not present

## 2018-02-15 DIAGNOSIS — Q8909 Congenital malformations of spleen: Secondary | ICD-10-CM | POA: Diagnosis not present

## 2018-02-15 DIAGNOSIS — K8689 Other specified diseases of pancreas: Secondary | ICD-10-CM | POA: Diagnosis not present

## 2018-02-23 DIAGNOSIS — R5383 Other fatigue: Secondary | ICD-10-CM | POA: Diagnosis not present

## 2018-02-23 DIAGNOSIS — Z6828 Body mass index (BMI) 28.0-28.9, adult: Secondary | ICD-10-CM | POA: Diagnosis not present

## 2018-02-23 DIAGNOSIS — R69 Illness, unspecified: Secondary | ICD-10-CM | POA: Diagnosis not present

## 2018-02-23 DIAGNOSIS — D649 Anemia, unspecified: Secondary | ICD-10-CM | POA: Diagnosis not present
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
## 2018-03-20 ENCOUNTER — Ambulatory Visit (INDEPENDENT_AMBULATORY_CARE_PROVIDER_SITE_OTHER): Payer: Medicare HMO | Admitting: Psychiatry

## 2018-03-20 ENCOUNTER — Encounter (HOSPITAL_COMMUNITY): Payer: Self-pay | Admitting: Psychiatry

## 2018-03-20 VITALS — BP 146/90 | HR 92 | Ht 63.0 in | Wt 171.0 lb

## 2018-03-20 DIAGNOSIS — F4310 Post-traumatic stress disorder, unspecified: Secondary | ICD-10-CM

## 2018-03-20 DIAGNOSIS — F331 Major depressive disorder, recurrent, moderate: Secondary | ICD-10-CM | POA: Diagnosis not present

## 2018-03-20 DIAGNOSIS — R69 Illness, unspecified: Secondary | ICD-10-CM | POA: Diagnosis not present

## 2018-03-20 MED ORDER — SERTRALINE HCL 50 MG PO TABS: ORAL_TABLET | ORAL | 0 refills | Status: DC

## 2018-03-20 NOTE — Patient Instructions (Signed)
1. Discontinue lexapro 2. Start sertraline 25 mg at night for one week, then 50 mg at night  3. Return to clinic in one month for 30 mins 4. Referral to therapy

## 2018-04-03 DIAGNOSIS — E038 Other specified hypothyroidism: Secondary | ICD-10-CM | POA: Diagnosis not present
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
## 2018-04-19 ENCOUNTER — Ambulatory Visit (INDEPENDENT_AMBULATORY_CARE_PROVIDER_SITE_OTHER): Payer: Medicare HMO | Admitting: Psychiatry

## 2018-04-19 ENCOUNTER — Encounter (HOSPITAL_COMMUNITY): Payer: Self-pay | Admitting: Psychiatry

## 2018-04-19 VITALS — BP 121/87 | HR 110 | Ht 63.0 in | Wt 175.2 lb

## 2018-04-19 DIAGNOSIS — F4310 Post-traumatic stress disorder, unspecified: Secondary | ICD-10-CM

## 2018-04-19 DIAGNOSIS — R69 Illness, unspecified: Secondary | ICD-10-CM | POA: Diagnosis not present

## 2018-04-19 DIAGNOSIS — F331 Major depressive disorder, recurrent, moderate: Secondary | ICD-10-CM

## 2018-04-19 MED ORDER — SERTRALINE HCL 100 MG PO TABS: 100.0000 mg | ORAL_TABLET | Freq: Every day | ORAL | 0 refills | Status: DC

## 2018-04-19 NOTE — Patient Instructions (Signed)
1. Increase sertraline 100 mg daily  2. Return to clinic in two months for 15 mins 

## 2018-04-23 DIAGNOSIS — E7801 Familial hypercholesterolemia: Secondary | ICD-10-CM | POA: Diagnosis not present

## 2018-04-23 DIAGNOSIS — E038 Other specified hypothyroidism: Secondary | ICD-10-CM | POA: Diagnosis not present

## 2018-04-23 DIAGNOSIS — R5383 Other fatigue: Secondary | ICD-10-CM | POA: Diagnosis not present

## 2018-04-23 DIAGNOSIS — E559 Vitamin D deficiency, unspecified: Secondary | ICD-10-CM | POA: Diagnosis not present

## 2018-04-23 DIAGNOSIS — I10 Essential (primary) hypertension: Secondary | ICD-10-CM | POA: Diagnosis not present

## 2018-04-23 DIAGNOSIS — E876 Hypokalemia: Secondary | ICD-10-CM | POA: Diagnosis not present

## 2018-04-23 DIAGNOSIS — E1165 Type 2 diabetes mellitus with hyperglycemia: Secondary | ICD-10-CM | POA: Diagnosis not present

## 2018-04-23 DIAGNOSIS — K219 Gastro-esophageal reflux disease without esophagitis: Secondary | ICD-10-CM | POA: Diagnosis not present

## 2018-04-23 DIAGNOSIS — D649 Anemia, unspecified: Secondary | ICD-10-CM | POA: Diagnosis not present

## 2018-04-26 DIAGNOSIS — I10 Essential (primary) hypertension: Secondary | ICD-10-CM | POA: Diagnosis not present

## 2018-04-26 DIAGNOSIS — K219 Gastro-esophageal reflux disease without esophagitis: Secondary | ICD-10-CM | POA: Diagnosis not present

## 2018-04-26 DIAGNOSIS — Z1331 Encounter for screening for depression: Secondary | ICD-10-CM | POA: Diagnosis not present

## 2018-04-26 DIAGNOSIS — E039 Hypothyroidism, unspecified: Secondary | ICD-10-CM | POA: Diagnosis not present

## 2018-04-26 DIAGNOSIS — E1165 Type 2 diabetes mellitus with hyperglycemia: Secondary | ICD-10-CM | POA: Diagnosis not present

## 2018-04-26 DIAGNOSIS — Z6828 Body mass index (BMI) 28.0-28.9, adult: Secondary | ICD-10-CM | POA: Diagnosis not present

## 2018-04-26 DIAGNOSIS — Z1389 Encounter for screening for other disorder: Secondary | ICD-10-CM | POA: Diagnosis not present

## 2018-04-26 DIAGNOSIS — R69 Illness, unspecified: Secondary | ICD-10-CM | POA: Diagnosis not present

## 2018-05-15 DIAGNOSIS — Z1231 Encounter for screening mammogram for malignant neoplasm of breast: Secondary | ICD-10-CM | POA: Diagnosis not present

## 2018-05-22 ENCOUNTER — Ambulatory Visit (HOSPITAL_COMMUNITY): Payer: Self-pay | Admitting: Licensed Clinical Social Worker
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
## 2018-06-20 ENCOUNTER — Ambulatory Visit (HOSPITAL_COMMUNITY): Payer: Self-pay | Admitting: Psychiatry

## 2018-06-20 ENCOUNTER — Telehealth (HOSPITAL_COMMUNITY): Payer: Self-pay | Admitting: *Deleted

## 2018-06-20 ENCOUNTER — Other Ambulatory Visit (HOSPITAL_COMMUNITY): Payer: Self-pay | Admitting: Psychiatry

## 2018-06-20 MED ORDER — SERTRALINE HCL 100 MG PO TABS: 100.0000 mg | ORAL_TABLET | Freq: Every day | ORAL | 0 refills | Status: DC

## 2018-06-20 NOTE — Telephone Encounter (Signed)
Ordered. Please advise the patient to be seen by primary care in the area if she is unable to come to the clinic anymore.

## 2018-06-20 NOTE — Telephone Encounter (Signed)
Dr Vanetta ShawlHisada Patient called requesting refill on Zoloft. She rescheduled recent appointment due to she has moved. Rx has been updated in system. Reminded that a f/u appointment will need to be made

## 2018-06-29 DIAGNOSIS — R911 Solitary pulmonary nodule: Secondary | ICD-10-CM | POA: Diagnosis not present

## 2018-07-20 DIAGNOSIS — R911 Solitary pulmonary nodule: Secondary | ICD-10-CM | POA: Diagnosis not present

## 2018-08-13 DIAGNOSIS — I10 Essential (primary) hypertension: Secondary | ICD-10-CM | POA: Diagnosis not present

## 2018-08-13 DIAGNOSIS — Z7689 Persons encountering health services in other specified circumstances: Secondary | ICD-10-CM | POA: Diagnosis not present

## 2018-08-13 DIAGNOSIS — E876 Hypokalemia: Secondary | ICD-10-CM | POA: Diagnosis not present

## 2018-08-13 DIAGNOSIS — E7801 Familial hypercholesterolemia: Secondary | ICD-10-CM | POA: Diagnosis not present

## 2018-08-13 DIAGNOSIS — R911 Solitary pulmonary nodule: Secondary | ICD-10-CM | POA: Diagnosis not present

## 2018-08-13 DIAGNOSIS — D649 Anemia, unspecified: Secondary | ICD-10-CM | POA: Diagnosis not present

## 2018-08-13 DIAGNOSIS — R634 Abnormal weight loss: Secondary | ICD-10-CM | POA: Diagnosis not present

## 2018-08-13 DIAGNOSIS — E1165 Type 2 diabetes mellitus with hyperglycemia: Secondary | ICD-10-CM | POA: Diagnosis not present

## 2018-08-13 DIAGNOSIS — E039 Hypothyroidism, unspecified: Secondary | ICD-10-CM | POA: Diagnosis not present

## 2018-08-13 DIAGNOSIS — K219 Gastro-esophageal reflux disease without esophagitis: Secondary | ICD-10-CM | POA: Diagnosis not present

## 2018-08-13 DIAGNOSIS — D519 Vitamin B12 deficiency anemia, unspecified: Secondary | ICD-10-CM | POA: Diagnosis not present

## 2018-08-17 DIAGNOSIS — I10 Essential (primary) hypertension: Secondary | ICD-10-CM | POA: Diagnosis not present

## 2018-08-17 DIAGNOSIS — E1165 Type 2 diabetes mellitus with hyperglycemia: Secondary | ICD-10-CM | POA: Diagnosis not present

## 2018-08-17 DIAGNOSIS — K219 Gastro-esophageal reflux disease without esophagitis: Secondary | ICD-10-CM | POA: Diagnosis not present

## 2018-08-17 DIAGNOSIS — E039 Hypothyroidism, unspecified: Secondary | ICD-10-CM | POA: Diagnosis not present

## 2018-08-17 DIAGNOSIS — Z23 Encounter for immunization: Secondary | ICD-10-CM | POA: Diagnosis not present

## 2018-08-17 DIAGNOSIS — R69 Illness, unspecified: Secondary | ICD-10-CM | POA: Diagnosis not present

## 2018-08-17 DIAGNOSIS — Z6830 Body mass index (BMI) 30.0-30.9, adult: Secondary | ICD-10-CM | POA: Diagnosis not present

## 2018-09-10 DIAGNOSIS — H524 Presbyopia: Secondary | ICD-10-CM | POA: Diagnosis not present

## 2018-09-10 DIAGNOSIS — E119 Type 2 diabetes mellitus without complications: Secondary | ICD-10-CM | POA: Diagnosis not present

## 2018-11-05 DIAGNOSIS — H524 Presbyopia: Secondary | ICD-10-CM | POA: Diagnosis not present

## 2018-12-07 DIAGNOSIS — K219 Gastro-esophageal reflux disease without esophagitis: Secondary | ICD-10-CM | POA: Diagnosis not present

## 2018-12-07 DIAGNOSIS — R946 Abnormal results of thyroid function studies: Secondary | ICD-10-CM | POA: Diagnosis not present

## 2018-12-07 DIAGNOSIS — R7989 Other specified abnormal findings of blood chemistry: Secondary | ICD-10-CM | POA: Diagnosis not present

## 2018-12-07 DIAGNOSIS — E039 Hypothyroidism, unspecified: Secondary | ICD-10-CM | POA: Diagnosis not present

## 2018-12-07 DIAGNOSIS — E876 Hypokalemia: Secondary | ICD-10-CM | POA: Diagnosis not present

## 2018-12-07 DIAGNOSIS — E785 Hyperlipidemia, unspecified: Secondary | ICD-10-CM | POA: Diagnosis not present

## 2018-12-07 DIAGNOSIS — R7309 Other abnormal glucose: Secondary | ICD-10-CM | POA: Diagnosis not present

## 2018-12-07 DIAGNOSIS — E1165 Type 2 diabetes mellitus with hyperglycemia: Secondary | ICD-10-CM | POA: Diagnosis not present

## 2018-12-07 DIAGNOSIS — E7801 Familial hypercholesterolemia: Secondary | ICD-10-CM | POA: Diagnosis not present

## 2018-12-07 DIAGNOSIS — I10 Essential (primary) hypertension: Secondary | ICD-10-CM | POA: Diagnosis not present

## 2018-12-07 DIAGNOSIS — R634 Abnormal weight loss: Secondary | ICD-10-CM | POA: Diagnosis not present

## 2018-12-07 DIAGNOSIS — D649 Anemia, unspecified: Secondary | ICD-10-CM | POA: Diagnosis not present

## 2018-12-07 DIAGNOSIS — R6889 Other general symptoms and signs: Secondary | ICD-10-CM | POA: Diagnosis not present

## 2018-12-14 DIAGNOSIS — Z6831 Body mass index (BMI) 31.0-31.9, adult: Secondary | ICD-10-CM | POA: Diagnosis not present

## 2018-12-14 DIAGNOSIS — I10 Essential (primary) hypertension: Secondary | ICD-10-CM | POA: Diagnosis not present

## 2018-12-14 DIAGNOSIS — Z0001 Encounter for general adult medical examination with abnormal findings: Secondary | ICD-10-CM | POA: Diagnosis not present

## 2018-12-17 ENCOUNTER — Encounter: Payer: Self-pay | Admitting: Gastroenterology

## 2019-03-14 DIAGNOSIS — E876 Hypokalemia: Secondary | ICD-10-CM | POA: Diagnosis not present

## 2019-03-14 DIAGNOSIS — E1165 Type 2 diabetes mellitus with hyperglycemia: Secondary | ICD-10-CM | POA: Diagnosis not present

## 2019-03-14 DIAGNOSIS — D649 Anemia, unspecified: Secondary | ICD-10-CM | POA: Diagnosis not present

## 2019-03-14 DIAGNOSIS — E7801 Familial hypercholesterolemia: Secondary | ICD-10-CM | POA: Diagnosis not present

## 2019-03-14 DIAGNOSIS — K219 Gastro-esophageal reflux disease without esophagitis: Secondary | ICD-10-CM | POA: Diagnosis not present

## 2019-03-14 DIAGNOSIS — I10 Essential (primary) hypertension: Secondary | ICD-10-CM | POA: Diagnosis not present

## 2019-03-25 DIAGNOSIS — E7801 Familial hypercholesterolemia: Secondary | ICD-10-CM | POA: Diagnosis not present

## 2019-03-25 DIAGNOSIS — E559 Vitamin D deficiency, unspecified: Secondary | ICD-10-CM | POA: Diagnosis not present

## 2019-03-25 DIAGNOSIS — E1165 Type 2 diabetes mellitus with hyperglycemia: Secondary | ICD-10-CM | POA: Diagnosis not present

## 2019-03-25 DIAGNOSIS — E876 Hypokalemia: Secondary | ICD-10-CM | POA: Diagnosis not present

## 2019-03-25 DIAGNOSIS — Z6831 Body mass index (BMI) 31.0-31.9, adult: Secondary | ICD-10-CM | POA: Diagnosis not present

## 2019-03-25 DIAGNOSIS — K219 Gastro-esophageal reflux disease without esophagitis: Secondary | ICD-10-CM | POA: Diagnosis not present

## 2019-03-25 DIAGNOSIS — E039 Hypothyroidism, unspecified: Secondary | ICD-10-CM | POA: Diagnosis not present

## 2019-03-25 DIAGNOSIS — R69 Illness, unspecified: Secondary | ICD-10-CM | POA: Diagnosis not present

## 2019-03-25 DIAGNOSIS — I10 Essential (primary) hypertension: Secondary | ICD-10-CM | POA: Diagnosis not present

## 2019-03-25 DIAGNOSIS — D649 Anemia, unspecified: Secondary | ICD-10-CM | POA: Diagnosis not present

## 2019-04-08 DIAGNOSIS — R911 Solitary pulmonary nodule: Secondary | ICD-10-CM | POA: Diagnosis not present

## 2019-05-23 DIAGNOSIS — Z6832 Body mass index (BMI) 32.0-32.9, adult: Secondary | ICD-10-CM | POA: Diagnosis not present

## 2019-05-23 DIAGNOSIS — I10 Essential (primary) hypertension: Secondary | ICD-10-CM | POA: Diagnosis not present

## 2019-07-03 DIAGNOSIS — I10 Essential (primary) hypertension: Secondary | ICD-10-CM | POA: Diagnosis not present

## 2019-07-03 DIAGNOSIS — E559 Vitamin D deficiency, unspecified: Secondary | ICD-10-CM | POA: Diagnosis not present

## 2019-07-03 DIAGNOSIS — E1165 Type 2 diabetes mellitus with hyperglycemia: Secondary | ICD-10-CM | POA: Diagnosis not present

## 2019-07-03 DIAGNOSIS — R634 Abnormal weight loss: Secondary | ICD-10-CM | POA: Diagnosis not present

## 2019-07-03 DIAGNOSIS — E876 Hypokalemia: Secondary | ICD-10-CM | POA: Diagnosis not present

## 2019-07-03 DIAGNOSIS — E7801 Familial hypercholesterolemia: Secondary | ICD-10-CM | POA: Diagnosis not present

## 2019-07-03 DIAGNOSIS — E039 Hypothyroidism, unspecified: Secondary | ICD-10-CM | POA: Diagnosis not present

## 2019-07-05 DIAGNOSIS — I10 Essential (primary) hypertension: Secondary | ICD-10-CM | POA: Diagnosis not present

## 2019-07-05 DIAGNOSIS — Z6833 Body mass index (BMI) 33.0-33.9, adult: Secondary | ICD-10-CM | POA: Diagnosis not present

## 2019-07-05 DIAGNOSIS — Z23 Encounter for immunization: Secondary | ICD-10-CM | POA: Diagnosis not present

## 2019-07-26 DIAGNOSIS — Z1231 Encounter for screening mammogram for malignant neoplasm of breast: Secondary | ICD-10-CM | POA: Diagnosis not present

## 2019-08-08 DIAGNOSIS — I10 Essential (primary) hypertension: Secondary | ICD-10-CM | POA: Diagnosis not present

## 2019-08-08 DIAGNOSIS — Z6833 Body mass index (BMI) 33.0-33.9, adult: Secondary | ICD-10-CM | POA: Diagnosis not present

## 2019-11-08 DIAGNOSIS — G4700 Insomnia, unspecified: Secondary | ICD-10-CM | POA: Diagnosis not present

## 2019-11-08 DIAGNOSIS — E7800 Pure hypercholesterolemia, unspecified: Secondary | ICD-10-CM | POA: Diagnosis not present

## 2019-11-08 DIAGNOSIS — E1165 Type 2 diabetes mellitus with hyperglycemia: Secondary | ICD-10-CM | POA: Diagnosis not present

## 2019-11-08 DIAGNOSIS — E039 Hypothyroidism, unspecified: Secondary | ICD-10-CM | POA: Diagnosis not present

## 2019-11-08 DIAGNOSIS — E559 Vitamin D deficiency, unspecified: Secondary | ICD-10-CM | POA: Diagnosis not present

## 2019-11-08 DIAGNOSIS — Z6833 Body mass index (BMI) 33.0-33.9, adult: Secondary | ICD-10-CM | POA: Diagnosis not present

## 2019-11-08 DIAGNOSIS — I10 Essential (primary) hypertension: Secondary | ICD-10-CM | POA: Diagnosis not present

## 2019-11-08 DIAGNOSIS — Z1389 Encounter for screening for other disorder: Secondary | ICD-10-CM | POA: Diagnosis not present

## 2019-11-08 DIAGNOSIS — Z1331 Encounter for screening for depression: Secondary | ICD-10-CM | POA: Diagnosis not present

## 2019-11-24 DIAGNOSIS — R69 Illness, unspecified: Secondary | ICD-10-CM | POA: Diagnosis not present

## 2019-12-11 ENCOUNTER — Ambulatory Visit: Payer: Medicare HMO | Admitting: Internal Medicine

## 2019-12-11 ENCOUNTER — Encounter: Payer: Self-pay | Admitting: Internal Medicine

## 2019-12-11 ENCOUNTER — Other Ambulatory Visit: Payer: Self-pay

## 2019-12-11 VITALS — BP 124/88 | HR 91 | Temp 98.0°F | Ht 60.0 in | Wt 164.8 lb

## 2019-12-11 DIAGNOSIS — E785 Hyperlipidemia, unspecified: Secondary | ICD-10-CM | POA: Diagnosis not present

## 2019-12-11 DIAGNOSIS — E1165 Type 2 diabetes mellitus with hyperglycemia: Secondary | ICD-10-CM | POA: Diagnosis not present

## 2019-12-11 MED ORDER — GLIPIZIDE 5 MG PO TABS: 5.0000 mg | ORAL_TABLET | Freq: Two times a day (BID) | ORAL | 3 refills | Status: DC

## 2019-12-11 MED ORDER — METFORMIN HCL ER 500 MG PO TB24: 500.0000 mg | ORAL_TABLET | Freq: Two times a day (BID) | ORAL | 1 refills | Status: DC

## 2019-12-11 NOTE — Progress Notes (Signed)
Name: Kayla [name]  MRN/ DOB: [id number], Jan 29, 1961   Age/ Sex: 59 y.o., female    PCP: Rory Percy, MD   Reason for Endocrinology Evaluation: Type 2 Diabetes Mellitus     Date of Initial Endocrinology Visit: 12/11/2019     PATIENT IDENTIFIER: Kayla [name] is a 59 y.o. female with a past medical history of HTN, Dyslipidemia and T2DM . The patient presented for initial endocrinology clinic visit on 12/11/2019 for consultative assistance with her diabetes management.    HPI: [name] was    Diagnosed with DM years ago  Prior Medications tried/Intolerance: Just metformin  Currently checking blood sugars 3 x / day,  before breakfast Hypoglycemia episodes : no        Hemoglobin A1c, peaking at 11.3% in 2021. Patient required assistance for hypoglycemia: no  Patient has required hospitalization within the last 1 year from hyper or hypoglycemia: no   In terms of diet, the patient eats small little frequent meals, has avoided sugar-sweetened beverages.    HOME DIABETES REGIMEN: Metformin 500 mg XR 2 tablet BID    Statin: Yes- 20 mg started a few months ago  ACE-I/ARB: Yes Prior Diabetic Education: no   METER DOWNLOAD SUMMARY: Date range evaluated: 2/25-3/07/2020 Fingerstick Blood Glucose Tests = 47 Average Number Tests/Day = 3.4 Overall Mean FS Glucose = 423   BG Ranges: Low = 105 High = HI   Hypoglycemic Events/30 Days: BG < 50 = 0 Episodes of symptomatic severe hypoglycemia = 0   DIABETIC COMPLICATIONS: Microvascular complications:    Denies: retinopathy, neuropathy , CKD   Last eye exam: Completed 11/2018    Macrovascular complications:    Denies: CAD, PVD, CVA   PAST HISTORY: Past Medical History:  Past Medical History:  Diagnosis Date  . Alcohol abuse    NONE SINCE 1997  . Alcohol-induced pancreatitis    NO ETOH SINCE 1997    Past Surgical History:  Past Surgical History:  Procedure Laterality Date  . ABDOMINAL HYSTERECTOMY      DUE TO DUB  . CHOLECYSTECTOMY     BILIARY COLIC-NO STONES      Social History:  reports that she has never smoked. She has never used smokeless tobacco. No history on file for alcohol and drug. Family History:  Family History  Problem Relation Age of Onset  . Dementia Father   . Depression Sister       HOME MEDICATIONS: Allergies as of 12/11/2019      Reactions   Codeine Nausea And Vomiting   Tramadol Nausea And Vomiting      Medication List       Accurate as of December 11, 2019  2:38 PM. If you have any questions, ask your nurse or doctor.        esomeprazole 40 MG capsule Commonly known as: NEXIUM Take 40 mg by mouth daily.   FLUoxetine 40 MG capsule Commonly known as: PROZAC Take 40 mg by mouth daily.   levothyroxine 50 MCG tablet Commonly known as: SYNTHROID Take 88 mcg by mouth daily.   lipase/protease/amylase 36000 UNITS Cpep capsule Commonly known as: Creon 1-2 PO WITH MEALS TO PREVENT DIARRHEA   losartan 50 MG tablet Commonly known as: COZAAR Take 50 mg by mouth daily.   metFORMIN 500 MG 24 hr tablet Commonly known as: GLUCOPHAGE-XR Take 2,000 mg by mouth daily.   ONE TOUCH ULTRA 2 w/Device Kit USE TO CHECK BLOOD SUGARS ONCE A DAY   OneTouch Delica  Lancets 33G Misc USE TO TEST BLOOD GLUCOSE ONCE A DAY   OneTouch Ultra test strip Generic drug: glucose blood USE TO CHECK BLOOD SUGAR ONCE A DAY   potassium chloride 10 MEQ CR capsule Commonly known as: MICRO-K Take by mouth.   potassium chloride 10 MEQ tablet Commonly known as: KLOR-CON Take 10 mEq by mouth daily.   sertraline 100 MG tablet Commonly known as: ZOLOFT Take 1 tablet (100 mg total) by mouth daily.   VITAMIN D3 PO Take by mouth.   zolpidem 10 MG tablet Commonly known as: AMBIEN Take 10 mg by mouth at bedtime.        ALLERGIES: Allergies  Allergen Reactions  . Codeine Nausea And Vomiting  . Tramadol Nausea And Vomiting     REVIEW OF SYSTEMS: A  comprehensive ROS was conducted with the patient and is negative except as per HPI and below:  Review of Systems  Gastrointestinal: Positive for diarrhea. Negative for nausea.  Neurological: Negative for tingling and tremors.      OBJECTIVE:   VITAL SIGNS: BP 124/88 (BP Location: Right Arm, Patient Position: Sitting, Cuff Size: Large)   Pulse 91   Temp 98 F (36.7 C)   Ht 5' (1.524 m)   Wt 164 lb 12.8 oz (74.8 kg)   SpO2 98%   BMI 32.19 kg/m    PHYSICAL EXAM:  General: Pt appears well and is in NAD  HEENT:  Eyes: External eye exam normal without stare, lid lag or exophthalmos.  EOM intact.   Neck: General: Supple without adenopathy or carotid bruits. Thyroid: Thyroid size normal.  No goiter or nodules appreciated. No thyroid bruit.  Lungs: Clear with good BS bilat with no rales, rhonchi, or wheezes  Heart: RRR with normal S1 and S2 and no gallops; no murmurs; no rub  Abdomen: Normoactive bowel sounds, soft, nontender, without masses or organomegaly palpable  Extremities:  Lower extremities - No pretibial edema. No lesions.  Skin: Normal texture and temperature to palpation.   Neuro: MS is good with appropriate affect, pt is alert and Ox3    DM foot exam: 12/11/2019  The skin of the feet is intact without sores or ulcerations. The pedal pulses are 2+ on right and 2+ on left. The sensation is intact to a screening 5.07, 10 gram monofilament bilaterally   DATA REVIEWED:  No results found for: HGBA1C Lab Results  Component Value Date   CREATININE 0.60 11/15/2016  11/09/2019 A1c 11.3%  Glucose 452 BUN/CR.14/0.96 GFR 74 TG 253 LDL 51 TSH 0.956  ASSESSMENT / PLAN / RECOMMENDATIONS:   1) Type 2 Diabetes Mellitus, Poorly controlled, Without  complications - Most recent A1c of 11.3 %. Goal A1c < 7.0 %.    Plan: GENERAL: I have discussed with the patient the pathophysiology of diabetes. We went over the natural progression of the disease. We talked about both insulin  resistance and insulin deficiency. We stressed the importance of lifestyle changes including diet and exercise. I explained the complications associated with diabetes including retinopathy, nephropathy, neuropathy as well as increased risk of cardiovascular disease. We went over the benefit seen with glycemic control.    I explained to the patient that diabetic patients are at higher than normal risk for amputations. .   I have explained to her that with an A1c of > 10.0% , pt should be started on insulin , pt would like to avoid insulin at this time, we discussed trying sulfonylureas and if this does not work  then the patient will have to try insulin at least temporarily, as I suspect she is glucose toxic at this time  Patient encouraged to contact us should her BG's remain above 250 mg/DL within the next few weeks of treatment.  Patient is having a lot of diarrhea issues that she attributes to metformin I would like to stop it, I have suggested that we at least try reducing the dose by 50% prior to completely stopping it, if she continues with diarrhea on the reduced dose, we will consider stopping it completely.  patient in agreement with this  We discussed the importance of diet and avoiding sugar sweetened beverages, as well as limiting snacks between the meals.  She will be referred to our RD   MEDICATIONS:  Decrease Metformin 500 mg, 1 tablet twice daily  Start glipizide 5 mg, 1 tablet before breakfast and 1 tablet before dinner  EDUCATION / INSTRUCTIONS:  BG monitoring instructions: Patient is instructed to check her blood sugars 2 times a day, fasting and dinner.  Call [facility] if: BG persistently < 70 or > 300. . I reviewed the Rule of 15 for the treatment of hypoglycemia in detail with the patient. Literature supplied.   2) Diabetic complications:   Eye: Does not have known diabetic retinopathy.   Neuro/ Feet: Does not have known diabetic peripheral  neuropathy.  Renal: Patient does not have known baseline CKD. She is on an ACEI/ARB at present.   3) Dyslipidemia: Patient is on simvastatin, LDL at goal, but her triglyceride levels are elevated, I am hoping that once her glucose control improves this will continue to trend down, will monitor at this point     Follow-up in 2 months     Signed electronically by: Mack Guise, MD  [facility]  [facility] [address] [place], [place] [address] Phone: [phone] FAX: [phone]   CC: Rory Percy, MD [place] [address] Phone: [phone]  Fax: [phone]    Return to Endocrinology clinic as below: No future appointments.

## 2019-12-11 NOTE — Patient Instructions (Addendum)
-   Decrease Metformin 500 mg, 1 tablet before Breakfast and 1 tablet before Dinner - Start Glipizide 5 mg, 1 Tablet before Breakfast and 1 tablet before [employer] healthy, lower carb lower calorie snacks: toss salad, vegetables, cottage cheese, peanut butter, low fat cheese / string cheese, lower sodium deli meat, tuna salad or chicken salad     HOW TO TREAT LOW BLOOD SUGARS (Blood sugar LESS THAN 70 MG/DL)  Please follow the RULE OF 15 for the treatment of hypoglycemia treatment (when your (blood sugars are less than 70 mg/dL)    STEP 1: Take 15 grams of carbohydrates when your blood sugar is low, which includes:   3-4 GLUCOSE TABS  OR  3-4 OZ OF JUICE OR REGULAR SODA OR  ONE TUBE OF GLUCOSE GEL     STEP 2: RECHECK blood sugar in 15 MINUTES STEP 3: If your blood sugar is still low at the 15 minute recheck --> then, go back to STEP 1 and treat AGAIN with another 15 grams of carbohydrates.

## 2019-12-12 ENCOUNTER — Encounter: Payer: Self-pay | Admitting: Internal Medicine

## 2019-12-16 DIAGNOSIS — H269 Unspecified cataract: Secondary | ICD-10-CM | POA: Diagnosis not present

## 2019-12-16 DIAGNOSIS — H52209 Unspecified astigmatism, unspecified eye: Secondary | ICD-10-CM | POA: Diagnosis not present

## 2019-12-16 DIAGNOSIS — H5203 Hypermetropia, bilateral: Secondary | ICD-10-CM | POA: Diagnosis not present

## 2019-12-16 DIAGNOSIS — H524 Presbyopia: Secondary | ICD-10-CM | POA: Diagnosis not present

## 2020-01-13 ENCOUNTER — Encounter: Payer: Medicare HMO | Attending: Internal Medicine | Admitting: Dietician

## 2020-01-13 ENCOUNTER — Other Ambulatory Visit: Payer: Self-pay

## 2020-01-13 ENCOUNTER — Encounter: Payer: Self-pay | Admitting: Dietician

## 2020-01-13 DIAGNOSIS — E1165 Type 2 diabetes mellitus with hyperglycemia: Secondary | ICD-10-CM

## 2020-01-13 NOTE — Patient Instructions (Signed)
Speak with your doctor about your depression if this is not improving.  Rethink what you drink and the amount of added sugar in your beverages.  Aim for beverages without carbohydrates.  Aim to be active most days. Consider walking for 30 minutes most days of the week.  Recommend a low saturated fat diet.  Choose small amounts of olive oil rather than coconut oil.  Aim for 2-3 Carb Choices per meal (30-45 grams) +/- 1 either way  Aim for 0 Carbs per snack if hungry  Include protein in moderation with your meals and snacks Consider reading food labels for Total Carbohydrate of foods Continue checking BG at alternate times per day  Continue taking medication as directed by MD

## 2020-01-13 NOTE — Progress Notes (Signed)
Diabetes Self-Management Education  Visit Type: First/Initial  Appt. Start Time: 1425 Appt. End Time: 1540  01/13/2020  [name], identified by name and date of birth, is a 59 y.o. female with a diagnosis of Diabetes: Type 2.   ASSESSMENT Patient is here today with her son.  History includes:  Type 2 diabetes, HTN, dyslipidemia and pancreatitis related gall stones per patient.  Medications include Metformin XR and Glipizide.  She states that the diarrhea is still present but improved with only taking metformin once daily rather than tid. Patient was advised at last visit with endocrinologist to begin insulin due to A1C 10.3%.  Patient did not want to start insulin yet due to fear of neeles. Blood sugars have improved from 400-500 to 200-300 since starting Glipizide but still remain high.  Showed patient and son an insulin pen as well as a syringe and needles.  Patient states that she is less fearful about this now.    Patient's son lives with her.  He avoids gluten, pork and beef. They share shopping and cooking.  States her eating has been better of the past 2 months- fewer cookies, much less snacking.  She reports no alcohol for the past 9 years.   States that she currently has a poor appetite currently and periodically and states that this is due to depression.  Her son states that depression effects her diabetes by not eating as healthfully or on time and also gets less exercise when depression. She is on disability.  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  Height 5\' 3"  (1.6 m), weight 163 lb (73.9 kg). Body mass index is 28.87 kg/m.  Diabetes Self-Management Education - 01/13/20 1431      Visit Information   Visit Type  First/Initial      Initial Visit   Diabetes Type  Type 2    Are you currently following a meal plan?  No    Are you taking your medications as prescribed?  Yes    Date Diagnosed  2000      Health Coping   How would you rate your overall  health?  Poor      Psychosocial Assessment   Patient Belief/Attitude about Diabetes  Motivated to manage diabetes    Self-care barriers  None    Self-management support  Family;Doctor's office    Other persons present  Patient;Family Member    Patient Concerns  Nutrition/Meal planning    Special Needs  None    Preferred Learning Style  No preference indicated    Learning Readiness  Ready    How often do you need to have someone help you when you read instructions, pamphlets, or other written materials from your doctor or pharmacy?  1 - Never    What is the last grade level you completed in school?  12th grade      Pre-Education Assessment   Patient understands the diabetes disease and treatment process.  Needs Instruction    Patient understands incorporating nutritional management into lifestyle.  Needs Instruction    Patient undertands incorporating physical activity into lifestyle.  Needs Instruction    Patient understands using medications safely.  Needs Instruction    Patient understands monitoring blood glucose, interpreting and using results  Needs Instruction    Patient understands prevention, detection, and treatment of acute complications.  Needs Instruction    Patient understands prevention, detection, and treatment of chronic complications.  Needs Instruction    Patient understands how to  develop strategies to address psychosocial issues.  Needs Instruction    Patient understands how to develop strategies to promote health/change behavior.  Needs Instruction      Complications   Last HgB A1C per patient/outside source  11.3 %   2021   How often do you check your blood sugar?  3-4 times/day    Fasting Blood glucose range (mg/dL)  180-200   states this has decreased from 500 since starting Glipizide   Postprandial Blood glucose range (mg/dL)  >200   >300   Number of hypoglycemic episodes per month  0    Number of hyperglycemic episodes per week  21    Can you tell when  your blood sugar is high?  Yes    What do you do if your blood sugar is high?  walks at times    Have you had a dilated eye exam in the past 12 months?  Yes    Have you had a dental exam in the past 12 months?  No    Are you checking your feet?  Yes    How many days per week are you checking your feet?  7      Dietary Intake   Breakfast  cereal (honey nut cheerios), 2% milk, coffee with 2T sugar, speatened creamer OR eggs, 2 strips bacon or [place] sausage, 2 slices white toast    Snack (morning)  none    Lunch  regular jello with fruit today due to nausea OR leftovers    Snack (afternoon)  occasional nuts   when bored   Dinner  GF spaghett with [place] meatballs, crackers    Snack (evening)  occasional tortilla chips    Beverage(s)  water, coffee with 2T sugar and sweetened creamer, unsweetened hot tea, regular soda 12 oz 2-3 times per week      Exercise   Exercise Type  Light (walking / raking leaves)   has a treadmill   How many days per week to you exercise?  3    How many minutes per day do you exercise?  30    Total minutes per week of exercise  90      Patient Education   Previous Diabetes Education  No    Disease state   Definition of diabetes, type 1 and 2, and the diagnosis of diabetes    Nutrition management   Role of diet in the treatment of diabetes and the relationship between the three main macronutrients and blood glucose level;Food label reading, portion sizes and measuring food.;Meal options for control of blood glucose level and chronic complications.    Physical activity and exercise   Role of exercise on diabetes management, blood pressure control and cardiac health.    Medications  Reviewed patients medication for diabetes, action, purpose, timing of dose and side effects.    Monitoring  Daily foot exams;Yearly dilated eye exam;Taught/discussed recording of test results and interpretation of SMBG.;Identified appropriate SMBG and/or A1C goals.    Acute complications   Taught treatment of hypoglycemia - the 15 rule.;Discussed and identified patients' treatment of hyperglycemia.    Chronic complications  Relationship between chronic complications and blood glucose control;Dental care;Retinopathy and reason for yearly dilated eye exams    Psychosocial adjustment  Worked with patient to identify barriers to care and solutions;Role of stress on diabetes      Individualized Goals (developed by patient)   Nutrition  General guidelines for healthy choices and portions discussed;Follow meal plan discussed  Physical Activity  Exercise 5-7 days per week;30 minutes per day    Medications  take my medication as prescribed    Monitoring   test my blood glucose as discussed    Reducing Risk  examine blood glucose patterns;increase portions of healthy fats    Health Coping  discuss diabetes with (comment)   MD, RD, CDE     Post-Education Assessment   Patient understands the diabetes disease and treatment process.  Demonstrates understanding / competency    Patient understands incorporating nutritional management into lifestyle.  Needs Review    Patient undertands incorporating physical activity into lifestyle.  Demonstrates understanding / competency    Patient understands using medications safely.  Needs Review    Patient understands monitoring blood glucose, interpreting and using results  Demonstrates understanding / competency    Patient understands prevention, detection, and treatment of acute complications.  Demonstrates understanding / competency    Patient understands prevention, detection, and treatment of chronic complications.  Demonstrates understanding / competency    Patient understands how to develop strategies to address psychosocial issues.  Needs Review    Patient understands how to develop strategies to promote health/change behavior.  Needs Review      Outcomes   Expected Outcomes  Demonstrated interest in learning. Expect positive outcomes     Future DMSE  2 months    Program Status  Not Completed       Individualized Plan for Diabetes Self-Management Training:   Learning Objective:  Patient will have a greater understanding of diabetes self-management. Patient education plan is to attend individual and/or group sessions per assessed needs and concerns.   Plan:   Patient Instructions  Speak with your doctor about your depression if this is not improving.  Rethink what you drink and the amount of added sugar in your beverages.  Aim for beverages without carbohydrates.  Aim to be active most days. Consider walking for 30 minutes most days of the week.  Recommend a low saturated fat diet.  Choose small amounts of olive oil rather than coconut oil.  Aim for 2-3 Carb Choices per meal (30-45 grams) +/- 1 either way  Aim for 0 Carbs per snack if hungry  Include protein in moderation with your meals and snacks Consider reading food labels for Total Carbohydrate of foods Continue checking BG at alternate times per day  Continue taking medication as directed by MD      Expected Outcomes:  Demonstrated interest in learning. Expect positive outcomes  Education material provided: ADA - How to Thrive: A Guide for Your Journey with Diabetes, meal plan card, label reading, snack list with focus on no carb snacks  If problems or questions, patient to contact team via:  Phone  Future DSME appointment: 2 months

## 2020-01-31 DIAGNOSIS — R69 Illness, unspecified: Secondary | ICD-10-CM | POA: Diagnosis not present

## 2020-02-10 ENCOUNTER — Other Ambulatory Visit: Payer: Self-pay

## 2020-02-12 ENCOUNTER — Ambulatory Visit: Payer: Medicare HMO | Admitting: Internal Medicine

## 2020-02-17 DIAGNOSIS — I10 Essential (primary) hypertension: Secondary | ICD-10-CM | POA: Diagnosis not present

## 2020-02-17 DIAGNOSIS — Z0001 Encounter for general adult medical examination with abnormal findings: Secondary | ICD-10-CM | POA: Diagnosis not present

## 2020-02-17 DIAGNOSIS — E039 Hypothyroidism, unspecified: Secondary | ICD-10-CM | POA: Diagnosis not present

## 2020-02-17 DIAGNOSIS — E7800 Pure hypercholesterolemia, unspecified: Secondary | ICD-10-CM | POA: Diagnosis not present

## 2020-02-17 DIAGNOSIS — E1165 Type 2 diabetes mellitus with hyperglycemia: Secondary | ICD-10-CM | POA: Diagnosis not present

## 2020-02-17 DIAGNOSIS — Z6829 Body mass index (BMI) 29.0-29.9, adult: Secondary | ICD-10-CM | POA: Diagnosis not present

## 2020-02-17 DIAGNOSIS — E559 Vitamin D deficiency, unspecified: Secondary | ICD-10-CM | POA: Diagnosis not present

## 2020-02-17 DIAGNOSIS — G4700 Insomnia, unspecified: Secondary | ICD-10-CM | POA: Diagnosis not present

## 2020-02-18 ENCOUNTER — Other Ambulatory Visit: Payer: Self-pay

## 2020-02-18 ENCOUNTER — Ambulatory Visit: Payer: Medicare HMO | Admitting: Internal Medicine

## 2020-02-21 ENCOUNTER — Ambulatory Visit: Payer: Medicare HMO | Admitting: Internal Medicine

## 2020-02-24 ENCOUNTER — Encounter: Payer: Self-pay | Admitting: Internal Medicine

## 2020-02-24 ENCOUNTER — Other Ambulatory Visit: Payer: Self-pay

## 2020-02-24 ENCOUNTER — Ambulatory Visit (INDEPENDENT_AMBULATORY_CARE_PROVIDER_SITE_OTHER): Payer: Medicare HMO | Admitting: Internal Medicine

## 2020-02-24 VITALS — BP 130/82 | HR 79 | Temp 98.0°F | Ht 63.0 in | Wt 163.8 lb

## 2020-02-24 DIAGNOSIS — E1165 Type 2 diabetes mellitus with hyperglycemia: Secondary | ICD-10-CM | POA: Diagnosis not present

## 2020-02-24 LAB — POCT GLYCOSYLATED HEMOGLOBIN (HGB A1C): Hemoglobin A1C: 8.3 % — AB (ref 4.0–5.6)

## 2020-02-24 NOTE — Progress Notes (Signed)
Name: Kayla [name]  Age/ Sex: 59 y.o., female   MRN/ DOB: [id number], September 11, 1961     PCP: Rosine Door   Reason for Endocrinology Evaluation: Type 2 Diabetes Mellitus  Initial Endocrine Consultative Visit: 12/11/2019    PATIENT IDENTIFIER: Kayla [name] is a 59 y.o. female with a past medical history of HTN, Dyslipidemia and T2DM. The patient has followed with Endocrinology clinic since 12/11/2019 for consultative assistance with management of her diabetes.  DIABETIC HISTORY:  Kayla [name] was diagnosed with T2 DM  Many years ago. She has been on metformin since her diagnosis.  Her hemoglobin A1c  peaking at 11.3 % in in 2021   On her initial visit to our clinic she had an A1c 11.3%, we reduced Metformin due to diarrhea , we started SU as she declined insulin.   SUBJECTIVE:   During the last visit (12/11/2019): A1c 11.3% . We decreased Metformin due to diarrhea, and started Glipizide.   Today (02/24/2020): Kayla [name] is here for a follow up on diabetes management.  She checks her blood sugars 1 times daily, preprandial to breakfast. The patient has not had hypoglycemic episodes since the last clinic visit.  Diarrhea has improved  HOME DIABETES REGIMEN:   Metformin 500 mg, 1 tablet twice daily  Glipizide 5 mg, 1 tablet before breakfast and 1 tablet before dinner  Statin: Yes  ACE-I/ARB: Yes    METER DOWNLOAD SUMMARY: Date range evaluated: 5/11-5/24/2021 Fingerstick Blood Glucose Tests = 17 Average Number Tests/Day = 1.2 Overall Mean FS Glucose = 150  BG Ranges: Low = 113 High = 183   Hypoglycemic Events/30 Days: BG < 50 = 0 Episodes of symptomatic severe hypoglycemia = 0    DIABETIC COMPLICATIONS: Microvascular complications:    Denies: retinopathy, neuropathy , CKD   Last eye exam: Completed 11/2018    Macrovascular complications:    Denies: CAD,  PVD, CVA  HISTORY:  Past Medical History:  Past Medical History:  Diagnosis Date  . Alcohol abuse    NONE SINCE 1997  . Alcohol-induced pancreatitis    NO ETOH SINCE 1997  . Diabetes mellitus without complication ([place])   . Hyperlipidemia   . Hypertension   . Thyroid disease    Past Surgical History:  Past Surgical History:  Procedure Laterality Date  . ABDOMINAL HYSTERECTOMY     DUE TO DUB  . CHOLECYSTECTOMY     BILIARY COLIC-NO STONES    Social History:  reports that she has never smoked. She has never used smokeless tobacco. No history on file for alcohol and drug. Family History:  Family History  Problem Relation Age of Onset  . Dementia Father   . Depression Sister      HOME MEDICATIONS: Allergies as of 02/24/2020      Reactions   Codeine Nausea And Vomiting   Tramadol Nausea And Vomiting      Medication List       Accurate as of Feb 24, 2020  7:11 AM. If you have any questions, ask your nurse or doctor.        esomeprazole 40 MG capsule Commonly known as: NEXIUM Take 40 mg by mouth daily.   FLUoxetine 40 MG capsule Commonly known as: PROZAC Take 40 mg by mouth daily.   glipiZIDE 5 MG tablet Commonly known as: GLUCOTROL Take 1 tablet (5 mg total) by mouth 2 (two) times daily before a meal.   levothyroxine 50 MCG tablet Commonly known as: SYNTHROID Take  88 mcg by mouth daily.   losartan 50 MG tablet Commonly known as: COZAAR Take 50 mg by mouth daily.   metFORMIN 500 MG 24 hr tablet Commonly known as: GLUCOPHAGE-XR Take 1 tablet (500 mg total) by mouth 2 (two) times daily with a meal.   multivitamin with minerals Tabs tablet Take 1 tablet by mouth daily.   ONE TOUCH ULTRA 2 w/Device Kit USE TO CHECK BLOOD SUGARS ONCE A DAY   OneTouch Delica Lancets 92E Misc USE TO TEST BLOOD GLUCOSE ONCE A DAY   OneTouch Ultra test strip Generic drug: glucose blood USE TO CHECK BLOOD SUGAR ONCE A DAY   potassium chloride 10 MEQ CR capsule Commonly  known as: MICRO-K Take by mouth.   potassium chloride 10 MEQ tablet Commonly known as: KLOR-CON Take 10 mEq by mouth daily.   simvastatin 20 MG tablet Commonly known as: ZOCOR Take 20 mg by mouth daily.   VITAMIN D3 PO Take by mouth.   zolpidem 10 MG tablet Commonly known as: AMBIEN Take 10 mg by mouth at bedtime.        OBJECTIVE:   Vital Signs: There were no vitals taken for this visit.  Wt Readings from Last 3 Encounters:  01/13/20 163 lb (73.9 kg)  12/11/19 164 lb 12.8 oz (74.8 kg)  01/17/18 169 lb (76.7 kg)     Exam: General: Pt appears well and is in NAD  Lungs: Clear with good BS bilat with no rales, rhonchi, or wheezes  Heart: RRR with normal S1 and S2 and no gallops; no murmurs; no rub  Abdomen: Normoactive bowel sounds, soft, nontender, without masses or organomegaly palpable  Extremities: No pretibial edema.   Neuro: MS is good with appropriate affect, pt is alert and Ox3    DM foot exam: 12/11/2019  The skin of the feet is intact without sores or ulcerations. The pedal pulses are 2+ on right and 2+ on left. The sensation is intact to a screening 5.07, 10 gram monofilament bilaterally    DATA REVIEWED:  No results found for: HGBA1C Lab Results  Component Value Date   CREATININE 0.60 11/15/2016   11/09/2019 A1c 11.3%  Glucose 452 BUN/CR.14/0.96 GFR 74 TG 253 LDL 51 TSH 0.956  ASSESSMENT / PLAN / RECOMMENDATIONS:   1) Type 2 Diabetes Mellitus, With improved Glycemic Control, Without complications - Most recent A1c of 8.3 %. Goal A1c < 7.0 %.      - A1c down from 11.3%  - I have congratulated the pt on improved glycemic control and lifestyle changes - Diarrhea has improved with reducing metformin dose  - No changes will be made today    MEDICATIONS: - Continue Metformin 500 mg, 1 tablet before Breakfast and 1 tablet before Dinner - Continue Glipizide 5 mg, 1 Tablet before Breakfast and 1 tablet before [place] /  INSTRUCTIONS:  BG monitoring instructions: Patient is instructed to check her blood sugars 2 times a day,fasting and supper    Call [facility] clinic if: BG persistently < 70 or > 300. . I reviewed the Rule of 15 for the treatment of hypoglycemia in detail with the patient. Literature supplied.    2) Diabetic complications:   Eye: Does not have known diabetic retinopathy.   Neuro/ Feet: Does not have known diabetic peripheral neuropathy .   Renal: Patient does not have known baseline CKD. She   is  on an ACEI/ARB at present. Check      F/U  in 6 months    Signed electronically by: Mack Guise, MD  [facility]  [facility] [address] [place], [place] [address] Phone: [phone] FAX: [phone]   CC: Rosine Door [place] [address] Phone: [phone]  Fax: [phone]  Return to Endocrinology clinic as below: Future Appointments  Date Time Provider [place]  02/24/2020 10:30 AM Shamleffer, Melanie Crazier, MD [id number] None  03/16/2020  2:00 PM Jobe, Barnabas Lister, RD [place] [facility]

## 2020-02-24 NOTE — Patient Instructions (Addendum)
-   Continue Metformin 500 mg, 1 tablet before Breakfast and 1 tablet before Dinner - Continue Glipizide 5 mg, 1 Tablet before Breakfast and 1 tablet before Dinner         HOW TO TREAT LOW BLOOD SUGARS (Blood sugar LESS THAN 70 MG/DL)  Please follow the RULE OF 15 for the treatment of hypoglycemia treatment (when your (blood sugars are less than 70 mg/dL)    STEP 1: Take 15 grams of carbohydrates when your blood sugar is low, which includes:   3-4 GLUCOSE TABS  OR  3-4 OZ OF JUICE OR REGULAR SODA OR  ONE TUBE OF GLUCOSE GEL     STEP 2: RECHECK blood sugar in 15 MINUTES STEP 3: If your blood sugar is still low at the 15 minute recheck --> then, go back to STEP 1 and treat AGAIN with another 15 grams of carbohydrates.

## 2020-03-03 ENCOUNTER — Other Ambulatory Visit: Payer: Self-pay | Admitting: Internal Medicine

## 2020-03-16 ENCOUNTER — Ambulatory Visit: Payer: Medicare HMO | Admitting: Dietician

## 2020-03-18 DIAGNOSIS — Z1211 Encounter for screening for malignant neoplasm of colon: Secondary | ICD-10-CM | POA: Diagnosis not present

## 2020-03-18 DIAGNOSIS — K8689 Other specified diseases of pancreas: Secondary | ICD-10-CM | POA: Diagnosis not present

## 2020-03-26 DIAGNOSIS — K8689 Other specified diseases of pancreas: Secondary | ICD-10-CM | POA: Diagnosis not present

## 2020-03-26 DIAGNOSIS — K838 Other specified diseases of biliary tract: Secondary | ICD-10-CM | POA: Diagnosis not present

## 2020-08-21 ENCOUNTER — Ambulatory Visit: Payer: Medicare HMO | Admitting: Internal Medicine

## 2020-08-26 ENCOUNTER — Other Ambulatory Visit: Payer: Self-pay | Admitting: Internal Medicine

## 2020-10-09 NOTE — Progress Notes (Addendum)
Name: Kayla [name]  Age/ Sex: 60 y.o., female   MRN/ DOB: [id number], 23-Jun-1961     PCP: Rosine Door   Reason for Endocrinology Evaluation: Type 2 Diabetes Mellitus  Initial Endocrine Consultative Visit: 12/11/2019    PATIENT IDENTIFIER: [name] is a 60 y.o. female with a past medical history of HTN, Dyslipidemia and T2DM. The patient has followed with Endocrinology clinic since 12/11/2019 for consultative assistance with management of her diabetes.  DIABETIC HISTORY:  [name] was diagnosed with T2 DM  Many years ago. She has been on metformin since her diagnosis.  Her hemoglobin A1c  peaking at 11.3 % in in 2021   On her initial visit to our clinic she had an A1c 11.3%, we reduced Metformin due to diarrhea , we started SU as she declined insulin.   SUBJECTIVE:   During the last visit (02/24/2020): A1c 8.3.% . We decreased Metformin due to diarrhea, and started Glipizide.      Today (10/12/2020): [name] is here for a follow up on diabetes management. She has not been to our clinic in 8 months.  She checks her blood sugars 0 times daily, preprandial to breakfast. The patient has not had hypoglycemic episodes since the last clinic visit.   She ate oatmeal and drank hot tea without sugar  Denies diarrhea      HOME DIABETES REGIMEN:   Metformin 500 mg, 1 tablet twice daily  Glipizide 5 mg, 1 tablet before breakfast and 1 tablet before dinner      Statin: Yes  ACE-I/ARB: Yes    METER DOWNLOAD SUMMARY: Date range evaluated: 5/11-5/24/2021 Fingerstick Blood Glucose Tests = 17 Average Number Tests/Day = 1.2 Overall Mean FS Glucose = 150  BG Ranges: Low = 113 High = 183   Hypoglycemic Events/30 Days: BG < 50 = 0 Episodes of symptomatic severe hypoglycemia = 0    DIABETIC COMPLICATIONS: Microvascular complications:    Denies: retinopathy, neuropathy , CKD   Last eye exam: Completed 11/2018    Macrovascular  complications:    Denies: CAD, PVD, CVA  HISTORY:  Past Medical History:  Past Medical History:  Diagnosis Date   Alcohol abuse    NONE SINCE 1997   Alcohol-induced pancreatitis    NO ETOH SINCE 1997   Diabetes mellitus without complication ([place])    Hyperlipidemia    Hypertension    Thyroid disease    Past Surgical History:  Past Surgical History:  Procedure Laterality Date   ABDOMINAL HYSTERECTOMY     DUE TO DUB   CHOLECYSTECTOMY     BILIARY COLIC-NO STONES    Social History:  reports that she has never smoked. She has never used smokeless tobacco. No history on file for alcohol use and drug use. Family History:  Family History  Problem Relation Age of Onset   Dementia Father    Depression Sister      HOME MEDICATIONS: Allergies as of 10/12/2020      Reactions   Codeine Nausea And Vomiting   Tramadol Nausea And Vomiting      Medication List       Accurate as of October 12, 2020 11:00 AM. If you have any questions, ask your nurse or doctor.        albuterol 108 (90 Base) MCG/ACT inhaler Commonly known as: VENTOLIN HFA SMARTSIG:2 Puff(s) By Mouth Every 4-6 Hours PRN   esomeprazole 40 MG capsule Commonly known as: NEXIUM Take 40 mg by mouth  daily.   FLUoxetine 40 MG capsule Commonly known as: PROZAC Take 40 mg by mouth daily.   glipiZIDE 5 MG tablet Commonly known as: GLUCOTROL TAKE 1 TABLET (5 MG TOTAL) BY MOUTH 2 (TWO) TIMES DAILY BEFORE A MEAL.   levothyroxine 50 MCG tablet Commonly known as: SYNTHROID Take 88 mcg by mouth daily.   losartan 50 MG tablet Commonly known as: COZAAR Take 50 mg by mouth daily.   metFORMIN 500 MG 24 hr tablet Commonly known as: GLUCOPHAGE-XR Take 1 tablet (500 mg total) by mouth 2 (two) times daily with a meal.   multivitamin with minerals Tabs tablet Take 1 tablet by mouth daily.   ONE TOUCH ULTRA 2 w/Device Kit USE TO CHECK BLOOD SUGARS ONCE A DAY   OneTouch Delica Lancets 09O Misc USE TO  TEST BLOOD GLUCOSE ONCE A DAY   OneTouch Ultra test strip Generic drug: glucose blood USE TO CHECK BLOOD SUGAR ONCE A DAY   potassium chloride 10 MEQ CR capsule Commonly known as: MICRO-K Take by mouth.   potassium chloride 10 MEQ tablet Commonly known as: KLOR-CON Take 10 mEq by mouth daily.   simvastatin 20 MG tablet Commonly known as: ZOCOR Take 20 mg by mouth daily.   VITAMIN D3 PO Take by mouth.   zolpidem 10 MG tablet Commonly known as: AMBIEN Take 10 mg by mouth at bedtime.        OBJECTIVE:   Vital Signs: BP 134/80    Pulse (!) 113    Ht 5' 3"  (1.6 m)    Wt 175 lb (79.4 kg)    SpO2 97%    BMI 31.00 kg/m   Wt Readings from Last 3 Encounters:  10/12/20 175 lb (79.4 kg)  02/24/20 163 lb 12.8 oz (74.3 kg)  01/13/20 163 lb (73.9 kg)     Exam: General: Pt appears well and is in NAD  Lungs: Clear with good BS bilat with no rales, rhonchi, or wheezes  Heart: RRR with normal S1 and S2 and no gallops; no murmurs; no rub  Abdomen: Normoactive bowel sounds, soft, nontender, without masses or organomegaly palpable  Extremities: No pretibial edema.   Neuro: MS is good with appropriate affect, pt is alert and Ox3    DM foot exam: 12/11/2019  The skin of the feet is intact without sores or ulcerations. The pedal pulses are 2+ on right and 2+ on left. The sensation is intact to a screening 5.07, 10 gram monofilament bilaterally    DATA REVIEWED:  Lab Results  Component Value Date   HGBA1C 8.3 (A) 02/24/2020   Lab Results  Component Value Date   CREATININE 0.60 11/15/2016   08/25/2020  Tg 134 HDL 49 T. Cholesterol 123  MA/Cr ratio 227.4  BUN/Cr 13/0.840    In-Office BG 402 mg/dL  ASSESSMENT / PLAN / RECOMMENDATIONS:   1) Type 2 Diabetes Mellitus, Poorly Controlled , With microalbuminuria  complications - Most recent A1c of 12.1%. Goal A1c < 7.0 %.      - Intolerant to higher doses of Metformin due to diarrhea  - Due to previous hx of  pancreatitis, DPP-4 inhibitors and GLP-1 agonists are contraindicated  -  She as encouraged to check her glucose more frequently at home  - With an A1c of 12.1 % I have offered her insulin which she agreed to this time . She assures me compliance with her medications  - We discussed add-on therapy with SGLT-2 inhibitors, she was cautioned against genital infections  MEDICATIONS:  - STOP Glipizide  - Continue Metformin 500 mg, 1 tablet before Breakfast and 1 tablet before Dinner [facility] 5 mg, 1 tablet with Breakfast  - Start Basaglar 14 units daily   EDUCATION / INSTRUCTIONS:  BG monitoring instructions: Patient is instructed to check her blood sugars 2 times a day,fasting and supper    Call [place] [facility] clinic if: BG persistently < 70   I reviewed the Rule of 15 for the treatment of hypoglycemia in detail with the patient. Literature supplied.    2) Diabetic complications:   Eye: Does not have known diabetic retinopathy.   Neuro/ Feet: Does not have known diabetic peripheral neuropathy .   Renal: Patient does not have known baseline CKD. She   is  on an ACEI/ARB at present. Check      F/U in 3 months    Signed electronically by: Mack Guise, MD  [facility] Endocrin[facility]  [facility] [address] [place], [place] [address] Phone: [phone] FAX: [phone]   CC: Rosine Door [place] [address] Phone: [phone]  Fax: [phone]  Return to Endocrinology clinic as below: No future appointments.

## 2020-10-12 ENCOUNTER — Ambulatory Visit (INDEPENDENT_AMBULATORY_CARE_PROVIDER_SITE_OTHER): Payer: 59 | Admitting: Internal Medicine

## 2020-10-12 ENCOUNTER — Encounter: Payer: Self-pay | Admitting: Internal Medicine

## 2020-10-12 ENCOUNTER — Other Ambulatory Visit: Payer: Self-pay

## 2020-10-12 VITALS — BP 134/80 | HR 113 | Ht 63.0 in | Wt 175.0 lb

## 2020-10-12 DIAGNOSIS — R809 Proteinuria, unspecified: Secondary | ICD-10-CM | POA: Diagnosis not present

## 2020-10-12 DIAGNOSIS — E1129 Type 2 diabetes mellitus with other diabetic kidney complication: Secondary | ICD-10-CM

## 2020-10-12 DIAGNOSIS — E1165 Type 2 diabetes mellitus with hyperglycemia: Secondary | ICD-10-CM | POA: Diagnosis not present

## 2020-10-12 LAB — POCT GLYCOSYLATED HEMOGLOBIN (HGB A1C): Hemoglobin A1C: 12.1 % — AB (ref 4.0–5.6)

## 2020-10-12 LAB — GLUCOSE, POCT (MANUAL RESULT ENTRY): POC Glucose: 402 mg/dl — AB (ref 70–99)

## 2020-10-12 MED ORDER — BASAGLAR KWIKPEN 100 UNIT/ML ~~LOC~~ SOPN: 14.0000 [IU] | PEN_INJECTOR | Freq: Every day | SUBCUTANEOUS | 6 refills | Status: DC

## 2020-10-12 MED ORDER — INSULIN PEN NEEDLE 32G X 4 MM MISC: 1.0000 | 6 refills | Status: DC

## 2020-10-12 MED ORDER — DAPAGLIFLOZIN PROPANEDIOL 5 MG PO TABS: 5.0000 mg | ORAL_TABLET | Freq: Every day | ORAL | 6 refills | Status: DC

## 2020-10-12 NOTE — Patient Instructions (Addendum)
-   STOP Glipizide  - Continue Metformin 500 mg, 1 tablet before Breakfast and 1 tablet before Dinner [employer] 5 mg, 1 tablet with Breakfast  - Start Basaglar 14 units daily         HOW TO TREAT LOW BLOOD SUGARS (Blood sugar LESS THAN 70 MG/DL)  Please follow the RULE OF 15 for the treatment of hypoglycemia treatment (when your (blood sugars are less than 70 mg/dL)    STEP 1: Take 15 grams of carbohydrates when your blood sugar is low, which includes:   3-4 GLUCOSE TABS  OR  3-4 OZ OF JUICE OR REGULAR SODA OR  ONE TUBE OF GLUCOSE GEL     STEP 2: RECHECK blood sugar in 15 MINUTES STEP 3: If your blood sugar is still low at the 15 minute recheck --> then, go back to STEP 1 and treat AGAIN with another 15 grams of carbohydrates.

## 2020-10-20 ENCOUNTER — Telehealth: Payer: Self-pay | Admitting: Internal Medicine

## 2020-10-20 MED ORDER — TRESIBA FLEXTOUCH 100 UNIT/ML ~~LOC~~ SOPN: 14.0000 [IU] | PEN_INJECTOR | Freq: Every day | SUBCUTANEOUS | 6 refills | Status: DC

## 2020-10-20 MED ORDER — EMPAGLIFLOZIN 10 MG PO TABS: 10.0000 mg | ORAL_TABLET | Freq: Every day | ORAL | 1 refills | Status: DC

## 2020-10-20 NOTE — Telephone Encounter (Signed)
Received fax from [facility] prescriptions drug plan regarding patient receiving a temporary supply of medication below.   Name of Drug:  Basaglar 100 unit/mL kwikpen Date filled:  10/12/2020 Reason for Notification: This drug is not covered on our formulary. Alterrnative medications that are covered include: Lantus, Toujeo max solostar, Levemir, Evaristo Bury.   Name of Drug:  Farxiga 5 mg tablet Date filled:  10/12/2020 Reason for Notification: This drug is not covered on our formulary. Alterrnative medications that are covered include: Invokana, Jardiance.   Option are to change medication or prior authorization.   Please advise.

## 2020-10-20 NOTE — Telephone Encounter (Signed)
Basaglar switched to [place] 14 units daily  Farxiga switched to Jardiance 10 mg daily     Thanks

## 2020-10-20 NOTE — Telephone Encounter (Signed)
Message left for patient to return my call.  

## 2020-10-22 NOTE — Telephone Encounter (Signed)
Spoken to patient and notified Dr Shamleffer's comments. Verbalized understanding.   

## 2020-10-30 ENCOUNTER — Telehealth: Payer: Self-pay | Admitting: Internal Medicine

## 2020-10-30 NOTE — Telephone Encounter (Signed)
Spoken to patient and notified Dr Shamleffer's comments. Verbalized understanding.   

## 2020-10-30 NOTE — Telephone Encounter (Signed)
Please increase Basaglar ( insulin ) to 20 units daily

## 2020-10-30 NOTE — Telephone Encounter (Signed)
Patient called to advise that her blood sugars are continuing to be high - running 350 and above.  Her fasting blood sugar this morning was 432  Call back [phone]

## 2020-10-30 NOTE — Telephone Encounter (Signed)
Called patient and she asked for blood sugar readings and she stated that she does have her meter in front of her at the moment. However, her blood sugar was 432 this morning. Yesterday, it was around 400 and 350. Patient only checks 1 or 2 times a day depending on how she feels.

## 2021-01-13 ENCOUNTER — Ambulatory Visit (INDEPENDENT_AMBULATORY_CARE_PROVIDER_SITE_OTHER): Payer: 59 | Admitting: Internal Medicine

## 2021-01-13 ENCOUNTER — Other Ambulatory Visit: Payer: Self-pay

## 2021-01-13 ENCOUNTER — Encounter: Payer: Self-pay | Admitting: Internal Medicine

## 2021-01-13 VITALS — BP 132/82 | HR 90 | Ht 63.0 in | Wt 170.2 lb

## 2021-01-13 DIAGNOSIS — E1165 Type 2 diabetes mellitus with hyperglycemia: Secondary | ICD-10-CM | POA: Diagnosis not present

## 2021-01-13 LAB — POCT GLUCOSE (DEVICE FOR HOME USE): POC Glucose: 174 mg/dl — AB (ref 70–99)

## 2021-01-13 LAB — POCT GLYCOSYLATED HEMOGLOBIN (HGB A1C): Hemoglobin A1C: 10.6 % — AB (ref 4.0–5.6)

## 2021-01-13 LAB — BASIC METABOLIC PANEL WITH GFR
BUN: 15 mg/dL (ref 6–23)
CO2: 29 meq/L (ref 19–32)
Calcium: 9.7 mg/dL (ref 8.4–10.5)
Chloride: 98 meq/L (ref 96–112)
Creatinine, Ser: 0.8 mg/dL (ref 0.40–1.20)
GFR: 79.28 mL/min
Glucose, Bld: 128 mg/dL — ABNORMAL HIGH (ref 70–99)
Potassium: 3.6 meq/L (ref 3.5–5.1)
Sodium: 138 meq/L (ref 135–145)

## 2021-01-13 MED ORDER — DEXCOM G6 TRANSMITTER MISC: 1.0000 | 3 refills | Status: DC

## 2021-01-13 MED ORDER — DEXCOM G6 SENSOR MISC: 1.0000 | 3 refills | Status: DC

## 2021-01-13 MED ORDER — TRESIBA FLEXTOUCH 100 UNIT/ML ~~LOC~~ SOPN: 18.0000 [IU] | PEN_INJECTOR | Freq: Every day | SUBCUTANEOUS | 3 refills | Status: DC

## 2021-01-13 MED ORDER — EMPAGLIFLOZIN 25 MG PO TABS: 25.0000 mg | ORAL_TABLET | Freq: Every day | ORAL | 3 refills | Status: DC

## 2021-01-13 NOTE — Progress Notes (Signed)
Name: Kayla [name]  Age/ Sex: 60 y.o., female   MRN/ DOB: [id number], 06/01/1961     PCP: Rosine Door   Reason for Endocrinology Evaluation: Type 2 Diabetes Mellitus  Initial Endocrine Consultative Visit: 12/11/2019    PATIENT IDENTIFIER: [name] is a 60 y.o. female with a past medical history of HTN, Dyslipidemia and T2DM. The patient has followed with Endocrinology clinic since 12/11/2019 for consultative assistance with management of her diabetes.  DIABETIC HISTORY:  Ms. Schreurs was diagnosed with T2 DM  Many years ago. She has been on metformin since her diagnosis.  Her hemoglobin A1c  peaking at 11.3 % in in 2021   On her initial visit to our clinic she had an A1c 11.3%, we reduced Metformin due to diarrhea , we started SU as she declined insulin.    Basal insulin and farxiga started 10/2020  SUBJECTIVE:   During the last visit (10/12/2020): A1c 12.1.% . We stopped Glipizide, started farxiga and basal insulin      Today (01/13/2021): Ms. Khalsa is here for a follow up on diabetes management.   She checks her blood sugars 1 times daily.. The patient has had hypoglycemic episodes since the last clinic visit, it was 52 mg/dL , this happened once since her last visit here, she was symptomatic.   She denies nausea or vomiting     HOME DIABETES REGIMEN:  Jardiance 10  mg, 1 tablet with Breakfast  Basaglar 20 units daily  Metformin 500 mg 1 tablet BID     Statin: Yes  ACE-I/ARB: Yes    METER DOWNLOAD SUMMARY: Did not bring     DIABETIC COMPLICATIONS: Microvascular complications:    Denies: retinopathy, neuropathy , CKD   Last eye exam: Completed 11/2018    Macrovascular complications:    Denies: CAD, PVD, CVA  HISTORY:  Past Medical History:  Past Medical History:  Diagnosis Date  . Alcohol abuse    NONE SINCE 1997  . Alcohol-induced pancreatitis    NO ETOH SINCE 1997  . Diabetes mellitus without complication  ([place])   . Hyperlipidemia   . Hypertension   . Thyroid disease    Past Surgical History:  Past Surgical History:  Procedure Laterality Date  . ABDOMINAL HYSTERECTOMY     DUE TO DUB  . CHOLECYSTECTOMY     BILIARY COLIC-NO STONES    Social History:  reports that she has never smoked. She has never used smokeless tobacco. No history on file for alcohol use and drug use. Family History:  Family History  Problem Relation Age of Onset  . Dementia Father   . Depression Sister      HOME MEDICATIONS: Allergies as of 01/13/2021      Reactions   Codeine Nausea And Vomiting   Tramadol Nausea And Vomiting      Medication List       Accurate as of January 13, 2021 10:22 AM. If you have any questions, ask your nurse or doctor.        albuterol 108 (90 Base) MCG/ACT inhaler Commonly known as: VENTOLIN HFA SMARTSIG:2 Puff(s) By Mouth Every 4-6 Hours PRN   Dexcom G6 Sensor Misc 1 Device by Does not apply route as directed. Started by: Dorita Sciara, MD   Dexcom G6 Transmitter Misc 1 Device by Does not apply route as directed. Started by: Dorita Sciara, MD   empagliflozin 25 MG Tabs tablet Commonly known as: Jardiance Take 1 tablet (25 mg  total) by mouth daily before breakfast. What changed:   medication strength  how much to take Changed by: Dorita Sciara, MD   esomeprazole 40 MG capsule Commonly known as: NEXIUM Take 40 mg by mouth daily.   FLUoxetine 40 MG capsule Commonly known as: PROZAC Take 40 mg by mouth daily.   Insulin Pen Needle 32G X 4 MM Misc 1 Device by Does not apply route as directed.   levothyroxine 50 MCG tablet Commonly known as: SYNTHROID Take 88 mcg by mouth daily.   losartan 50 MG tablet Commonly known as: COZAAR Take 50 mg by mouth daily.   metFORMIN 500 MG 24 hr tablet Commonly known as: GLUCOPHAGE-XR Take 1 tablet (500 mg total) by mouth 2 (two) times daily with a meal.   multivitamin with minerals Tabs  tablet Take 1 tablet by mouth daily.   ONE TOUCH ULTRA 2 w/Device Kit USE TO CHECK BLOOD SUGARS ONCE A DAY   OneTouch Delica Lancets 41P Misc USE TO TEST BLOOD GLUCOSE ONCE A DAY   OneTouch Ultra test strip Generic drug: glucose blood USE TO CHECK BLOOD SUGAR ONCE A DAY   potassium chloride 10 MEQ CR capsule Commonly known as: MICRO-K Take by mouth.   potassium chloride 10 MEQ tablet Commonly known as: KLOR-CON Take 10 mEq by mouth daily.   simvastatin 20 MG tablet Commonly known as: ZOCOR Take 20 mg by mouth daily.   Tyler Aas FlexTouch 100 UNIT/ML FlexTouch Pen Generic drug: insulin degludec Inject 18 Units into the skin daily. What changed: how much to take Changed by: Dorita Sciara, MD   VITAMIN D3 PO Take by mouth.   zolpidem 10 MG tablet Commonly known as: AMBIEN Take 10 mg by mouth at bedtime.        OBJECTIVE:   Vital Signs: BP 132/82   Pulse 90   Ht 5' 3"  (1.6 m)   Wt 170 lb 4 oz (77.2 kg)   SpO2 98%   BMI 30.16 kg/m   Wt Readings from Last 3 Encounters:  01/13/21 170 lb 4 oz (77.2 kg)  10/12/20 175 lb (79.4 kg)  02/24/20 163 lb 12.8 oz (74.3 kg)     Exam: General: Pt appears well and is in NAD  Lungs: Clear with good BS bilat with no rales, rhonchi, or wheezes  Heart: RRR   Abdomen: Normoactive bowel sounds, soft, nontender, without masses or organomegaly palpable  Extremities: No pretibial edema.   Neuro: MS is good with appropriate affect, pt is alert and Ox3    DM foot exam: 01/13/2021  The skin of the feet is intact without sores or ulcerations. The pedal pulses are 2+ on right and 2+ on left. The sensation is intact to a screening 5.07, 10 gram monofilament bilaterally    DATA REVIEWED:  Lab Results  Component Value Date   HGBA1C 10.6 (A) 01/13/2021   HGBA1C 12.1 (A) 10/12/2020   HGBA1C 8.3 (A) 02/24/2020   Lab Results  Component Value Date   CREATININE 0.60 11/15/2016    Results for Cullimore, BRUCHY MIKEL (MRN  [id number]) as of 01/14/2021 14:34  Ref. Range 01/13/2021 10:25  Sodium Latest Ref Range: 135 - 145 mEq/L 138  Potassium Latest Ref Range: 3.5 - 5.1 mEq/L 3.6  Chloride Latest Ref Range: 96 - 112 mEq/L 98  CO2 Latest Ref Range: 19 - 32 mEq/L 29  Glucose Latest Ref Range: 70 - 99 mg/dL 128 (H)  BUN Latest Ref Range: 6 - 23 mg/dL 15  Creatinine Latest Ref Range: 0.40 - 1.20 mg/dL 0.80  Calcium Latest Ref Range: 8.4 - 10.5 mg/dL 9.7  GFR Latest Ref Range: >60.00 mL/min 79.28     08/25/2020  Tg 134 HDL 49 T. Cholesterol 123  MA/Cr ratio 227.4  BUN/Cr 13/0.840  ASSESSMENT / PLAN / RECOMMENDATIONS:   1) Type 2 Diabetes Mellitus, Poorly Controlled , With microalbuminuria  complications - Most recent A1c of 10.6%. Goal A1c < 7.0 %.     -A1c trending down from 12.1% -She had 1 episode of fasting hypoglycemia, will reduce basal insulin, the main reason for hyperglycemia is during the day postprandial. -BMP today shows normal electrolytes and GFR -She has a history of pancreatitis, hence GLP-1 agonist and DPP 4 inhibitors are contraindicated.   MEDICATIONS: - Continue Metformin 500 mg, 1 tablet before Breakfast and 1 tablet before Dinner - Increase Jardiance 25 mg, 1 tablet with Breakfast  - Decrease Tresiba 18 units daily    EDUCATION / INSTRUCTIONS:  BG monitoring instructions: Patient is instructed to check her blood sugars 2 times a day,fasting and supper    Call [facility] if: BG persistently < 70  . I reviewed the Rule of 15 for the treatment of hypoglycemia in detail with the patient. Literature supplied.    2) Diabetic complications:   Eye: Does not have known diabetic retinopathy.   Neuro/ Feet: Does not have known diabetic peripheral neuropathy .   Renal: Patient does not have known baseline CKD. She   is  on an ACEI/ARB at present. Check      F/U in 3 months    Signed electronically by: Mack Guise, MD  [facility]  Endocrinology  [facility] [address] [place], [place] [address] Phone: [phone] FAX: [phone]   CC: Rosine Door [place] [address] Phone: [phone]  Fax: [phone]  Return to Endocrinology clinic as below: No future appointments.

## 2021-01-13 NOTE — Patient Instructions (Addendum)
-   Continue Metformin 500 mg, 1 tablet before Breakfast and 1 tablet before Dinner - Increase Jardiance 25 mg, 1 tablet with Breakfast  - Decrease Tresiba 18 units daily         HOW TO TREAT LOW BLOOD SUGARS (Blood sugar LESS THAN 70 MG/DL)  Please follow the RULE OF 15 for the treatment of hypoglycemia treatment (when your (blood sugars are less than 70 mg/dL)    STEP 1: Take 15 grams of carbohydrates when your blood sugar is low, which includes:   3-4 GLUCOSE TABS  OR  3-4 OZ OF JUICE OR REGULAR SODA OR  ONE TUBE OF GLUCOSE GEL     STEP 2: RECHECK blood sugar in 15 MINUTES STEP 3: If your blood sugar is still low at the 15 minute recheck --> then, go back to STEP 1 and treat AGAIN with another 15 grams of carbohydrates.

## 2021-04-30 ENCOUNTER — Other Ambulatory Visit: Payer: Self-pay | Admitting: Internal Medicine

## 2021-05-10 ENCOUNTER — Other Ambulatory Visit (HOSPITAL_COMMUNITY): Payer: Self-pay | Admitting: Physician Assistant

## 2021-05-10 DIAGNOSIS — Z1231 Encounter for screening mammogram for malignant neoplasm of breast: Secondary | ICD-10-CM

## 2021-05-13 ENCOUNTER — Other Ambulatory Visit: Payer: Self-pay

## 2021-05-19 ENCOUNTER — Ambulatory Visit (INDEPENDENT_AMBULATORY_CARE_PROVIDER_SITE_OTHER): Payer: Medicare (Managed Care) | Admitting: Internal Medicine

## 2021-05-19 ENCOUNTER — Other Ambulatory Visit: Payer: Self-pay

## 2021-05-19 ENCOUNTER — Encounter: Payer: Self-pay | Admitting: Internal Medicine

## 2021-05-19 VITALS — BP 124/80 | HR 93 | Ht 64.0 in | Wt 165.4 lb

## 2021-05-19 DIAGNOSIS — E1129 Type 2 diabetes mellitus with other diabetic kidney complication: Secondary | ICD-10-CM

## 2021-05-19 DIAGNOSIS — R809 Proteinuria, unspecified: Secondary | ICD-10-CM | POA: Diagnosis not present

## 2021-05-19 LAB — POCT GLYCOSYLATED HEMOGLOBIN (HGB A1C): Hemoglobin A1C: 7 % — AB (ref 4.0–5.6)

## 2021-05-19 MED ORDER — METFORMIN HCL ER 500 MG PO TB24: 500.0000 mg | ORAL_TABLET | Freq: Two times a day (BID) | ORAL | 3 refills | Status: DC

## 2021-05-19 MED ORDER — DAPAGLIFLOZIN PROPANEDIOL 10 MG PO TABS: 10.0000 mg | ORAL_TABLET | Freq: Every day | ORAL | 3 refills | Status: DC

## 2021-05-19 MED ORDER — TRESIBA FLEXTOUCH 100 UNIT/ML ~~LOC~~ SOPN: 16.0000 [IU] | PEN_INJECTOR | Freq: Every day | SUBCUTANEOUS | 3 refills | Status: DC

## 2021-05-19 NOTE — Patient Instructions (Addendum)
-  Keep Up the Good Work ! -  Continue Metformin 500 mg, 1 tablet before Breakfast and 1 tablet before Dinner - Increase Farxiga to 10 mg, 1 tablet with Breakfast  - Decrease Tresiba to 16  units daily        HOW TO TREAT LOW BLOOD SUGARS (Blood sugar LESS THAN 70 MG/DL) Please follow the RULE OF 15 for the treatment of hypoglycemia treatment (when your (blood sugars are less than 70 mg/dL)   STEP 1: Take 15 grams of carbohydrates when your blood sugar is low, which includes:  3-4 GLUCOSE TABS  OR 3-4 OZ OF JUICE OR REGULAR SODA OR ONE TUBE OF GLUCOSE GEL    STEP 2: RECHECK blood sugar in 15 MINUTES STEP 3: If your blood sugar is still low at the 15 minute recheck --> then, go back to STEP 1 and treat AGAIN with another 15 grams of carbohydrates.

## 2021-05-19 NOTE — Progress Notes (Signed)
Name: Kayla [name]  Age/ Sex: 60 y.o., female   MRN/ DOB: [id number], 1961/01/09     PCP: Kayla [name]   Reason for [name] Evaluation: Type 2 Diabetes Mellitus  Initial Endocrine Consultative Visit: 12/11/2019    PATIENT IDENTIFIER: Kayla [name] is a 60 y.o. female with a past medical history of HTN, Dyslipidemia and T2DM. The patient has followed with [name] clinic since 12/11/2019 for consultative assistance with management of her diabetes.  DIABETIC HISTORY:  Kayla [name] was diagnosed with T2 DM  Many years ago. She has been on metformin since her diagnosis.  Her hemoglobin A1c  peaking at 11.3 % in in 2021   On her initial visit to our clinic she had an A1c 11.3%, we reduced Metformin due to diarrhea , we started SU as she declined insulin.    Basal insulin and farxiga started 10/2020  SUBJECTIVE:   During the last visit (01/13/2021): A1c 10.6% . We adjusted tresiba, continued   metformin and increased Jardiance       Today (05/19/2021): Kayla [name] is here for a follow up on diabetes management.   She checks her blood sugars 1 times daily.The patient has had not hypoglycemic episodes since the last clinic visit,  She denies nausea or vomiting or diarrhea     HOME DIABETES REGIMEN:  Farxiga 5 mg, 1 tablet with Breakfast  Tresiba 18 units daily  Metformin 500 mg 1 tablet BID     Statin: Yes  ACE-I/ARB: Yes    METER DOWNLOAD SUMMARY: unable to download  72- 244    DIABETIC COMPLICATIONS: Microvascular complications:    Denies: retinopathy, neuropathy , CKD  Last eye exam: Completed 2020- has appointment next week        Macrovascular complications:    Denies: CAD, PVD, CVA  HISTORY:  Past Medical History:  Past Medical History:  Diagnosis Date   Alcohol abuse    NONE SINCE 1997   Alcohol-induced pancreatitis    NO ETOH SINCE 1997   Diabetes mellitus without complication ([place])    Hyperlipidemia     Hypertension    Thyroid disease    Past Surgical History:  Past Surgical History:  Procedure Laterality Date   ABDOMINAL HYSTERECTOMY     DUE TO DUB   CHOLECYSTECTOMY     BILIARY COLIC-NO STONES   Social History:  reports that she has never smoked. She has never used smokeless tobacco. No history on file for alcohol use and drug use. Family History:  Family History  Problem Relation Age of Onset   Dementia Father    Depression Sister      HOME MEDICATIONS: Allergies as of 05/19/2021       Reactions   Codeine Nausea And Vomiting   Tramadol Nausea And Vomiting        Medication List        Accurate as of May 19, 2021  7:14 AM. If you have any questions, ask your nurse or doctor.          albuterol 108 (90 Base) MCG/ACT inhaler Commonly known as: VENTOLIN HFA SMARTSIG:2 Puff(s) By Mouth Every 4-6 Hours PRN   BD Pen Needle Nano 2nd Gen 32G X 4 MM Misc Generic drug: Insulin Pen Needle Use as directed to inject insulin daily   Dexcom G6 Sensor Misc 1 Device by Does not apply route as directed.   Dexcom G6 Transmitter Misc 1 Device by Does not apply route as directed.  empagliflozin 25 MG Tabs tablet Commonly known as: Jardiance Take 1 tablet (25 mg total) by mouth daily before breakfast.   esomeprazole 40 MG capsule Commonly known as: NEXIUM Take 40 mg by mouth daily.   FLUoxetine 40 MG capsule Commonly known as: PROZAC Take 40 mg by mouth daily.   levothyroxine 50 MCG tablet Commonly known as: SYNTHROID Take 88 mcg by mouth daily.   losartan 50 MG tablet Commonly known as: COZAAR Take 50 mg by mouth daily.   metFORMIN 500 MG 24 hr tablet Commonly known as: GLUCOPHAGE-XR Take 1 tablet (500 mg total) by mouth 2 (two) times daily with a meal.   multivitamin with minerals Tabs tablet Take 1 tablet by mouth daily.   ONE TOUCH ULTRA 2 w/Device Kit USE TO CHECK BLOOD SUGARS ONCE A DAY   OneTouch Delica Lancets 58N Misc USE TO TEST BLOOD  GLUCOSE ONCE A DAY   OneTouch Ultra test strip Generic drug: glucose blood USE TO CHECK BLOOD SUGAR ONCE A DAY   potassium chloride 10 MEQ CR capsule Commonly known as: MICRO-K Take by mouth.   potassium chloride 10 MEQ tablet Commonly known as: KLOR-CON Take 10 mEq by mouth daily.   simvastatin 20 MG tablet Commonly known as: ZOCOR Take 20 mg by mouth daily.   Kayla [name] FlexTouch 100 UNIT/ML FlexTouch Pen Generic drug: insulin degludec Inject 18 Units into the skin daily.   VITAMIN D3 PO Take by mouth.   zolpidem 10 MG tablet Commonly known as: AMBIEN Take 10 mg by mouth at bedtime.         OBJECTIVE:   Vital Signs: BP 124/80 (BP Location: Left Arm, Patient Position: Sitting, Cuff Size: Normal)   Pulse 93   Ht _0  (1.626 m)   Wt 165 lb 6.4 oz (75 kg)   SpO2 96%   BMI 28.39 kg/m   Wt Readings from Last 3 Encounters:  01/13/21 170 lb 4 oz (77.2 kg)  10/12/20 175 lb (79.4 kg)  02/24/20 163 lb 12.8 oz (74.3 kg)     Exam: General: Pt appears well and is in NAD  Lungs: Clear with good BS bilat with no rales, rhonchi, or wheezes  Heart: RRR   Abdomen: Normoactive bowel sounds, soft, nontender, without masses or organomegaly palpable  Extremities: No pretibial edema.   Neuro: MS is good with appropriate affect, pt is alert and Ox3    DM foot exam: 01/13/2021   The skin of the feet is intact without sores or ulcerations. The pedal pulses are 2+ on right and 2+ on left. The sensation is intact to a screening 5.07, 10 gram monofilament bilaterally      DATA REVIEWED:  Lab Results  Component Value Date   HGBA1C 10.6 (A) 01/13/2021   HGBA1C 12.1 (A) 10/12/2020   HGBA1C 8.3 (A) 02/24/2020   Lab Results  Component Value Date   CREATININE 0.80 01/13/2021    Results for [name], Kayla [name] (MRN [id number]) as of 01/14/2021 14:34  Ref. Range 01/13/2021 10:25  Sodium Latest Ref Range: 135 - 145 mEq/L 138  Potassium Latest Ref Range: 3.5 - 5.1 mEq/L 3.6   Chloride Latest Ref Range: 96 - 112 mEq/L 98  CO2 Latest Ref Range: 19 - 32 mEq/L 29  Glucose Latest Ref Range: 70 - 99 mg/dL 128 (H)  BUN Latest Ref Range: 6 - 23 mg/dL 15  Creatinine Latest Ref Range: 0.40 - 1.20 mg/dL 0.80  Calcium Latest Ref Range: 8.4 - 10.5 mg/dL 9.7  GFR Latest Ref Range: >60.00 mL/min 79.28     08/25/2020  Tg 134 HDL 49 T. Cholesterol 123  MA/Cr ratio 227.4  BUN/Cr 13/0.840  ASSESSMENT / PLAN / RECOMMENDATIONS:   1) Type 2 Diabetes Mellitus, optimally Controlled , With microalbuminuria  complications - Most recent A1c of 7.0 %. Goal A1c < 7.0 %.     -A1c trending down from 10.6%  - I have praised the pt on optimizing glucose control  - Dexcom not covered, she declines Freestyle libre at this time, she is comfortable with finger sticks  -She has a history of pancreatitis, hence GLP-1 agonist and DPP 4 inhibitors are contraindicated. - Somehow we have jardiance on her list but she assures me she has [place] ? Will increase Farxiga and reduce insulin as below    MEDICATIONS: - Continue Metformin 500 mg, 1 tablet before Breakfast and 1 tablet before Dinner - Increase Farxiga to 10 mg, 1 tablet with Breakfast  - Decrease Tresiba 16 units daily    EDUCATION / INSTRUCTIONS: BG monitoring instructions: Patient is instructed to check her blood sugars 2 times a day,fasting and supper   Call [name] clinic if: BG persistently < 70  I reviewed the Rule of 15 for the treatment of hypoglycemia in detail with the patient. Literature supplied.    2) Diabetic complications:  Eye: Does not have known diabetic retinopathy.  Neuro/ Feet: Does not have known diabetic peripheral neuropathy .  Renal: Patient does not have known baseline CKD. She   is  on an ACEI/ARB at present. Check      F/U in 6 months    Signed electronically by: Mack Guise, MD  [name]  [facility] [address] [place], [place] [address] Phone: [phone] FAX: [phone]   CC: Kayla [name] [place] [address] Phone: [phone]  Fax: [phone]  Return to [name] clinic as below: Future Appointments  Date Time Provider [place]  05/19/2021 10:30 AM Anikka Marsan, Melanie Crazier, MD [id number] None  05/28/2021  1:15 PM AP-MM 1 AP-MM [place] [facility]

## 2021-05-28 ENCOUNTER — Ambulatory Visit (HOSPITAL_COMMUNITY)
Admission: RE | Admit: 2021-05-28 | Discharge: 2021-05-28 | Disposition: A | Discharge status: Home / Self Care | Payer: Medicare (Managed Care) | Source: Ambulatory Visit | Attending: Physician Assistant | Admitting: Physician Assistant

## 2021-05-28 ENCOUNTER — Other Ambulatory Visit: Payer: Self-pay

## 2021-05-28 DIAGNOSIS — Z1231 Encounter for screening mammogram for malignant neoplasm of breast: Secondary | ICD-10-CM | POA: Insufficient documentation

## 2021-06-03 ENCOUNTER — Other Ambulatory Visit: Payer: Self-pay | Admitting: Internal Medicine

## 2021-06-03 MED ORDER — TRESIBA FLEXTOUCH 100 UNIT/ML ~~LOC~~ SOPN: 16.0000 [IU] | PEN_INJECTOR | Freq: Every day | SUBCUTANEOUS | 3 refills | Status: DC

## 2021-06-03 MED ORDER — METFORMIN HCL ER 500 MG PO TB24: 500.0000 mg | ORAL_TABLET | Freq: Two times a day (BID) | ORAL | 3 refills | Status: DC

## 2021-06-03 MED ORDER — DAPAGLIFLOZIN PROPANEDIOL 10 MG PO TABS: 10.0000 mg | ORAL_TABLET | Freq: Every day | ORAL | 3 refills | Status: DC

## 2021-06-29 ENCOUNTER — Other Ambulatory Visit: Payer: Self-pay | Admitting: Internal Medicine

## 2021-06-29 ENCOUNTER — Telehealth: Payer: Self-pay | Admitting: Pharmacy Technician

## 2021-06-29 NOTE — Telephone Encounter (Signed)
Patient Advocate Encounter   Received notification from [employer] that prior authorization for BASAGLAR is required.   KEY: [id number]  *patient currently receives [place] which is an equivalent to [employer] prior authorization. Nothing further needed   Jeannette How, CPhT Patient Advocate [facility] Phone: [phone] Fax:  [phone]

## 2021-11-25 ENCOUNTER — Ambulatory Visit (INDEPENDENT_AMBULATORY_CARE_PROVIDER_SITE_OTHER): Payer: Medicare HMO | Admitting: Internal Medicine

## 2021-11-25 ENCOUNTER — Other Ambulatory Visit: Payer: Self-pay

## 2021-11-25 ENCOUNTER — Encounter: Payer: Self-pay | Admitting: Internal Medicine

## 2021-11-25 VITALS — BP 132/76 | HR 99 | Ht 64.0 in | Wt 168.0 lb

## 2021-11-25 DIAGNOSIS — R809 Proteinuria, unspecified: Secondary | ICD-10-CM | POA: Diagnosis not present

## 2021-11-25 DIAGNOSIS — E1129 Type 2 diabetes mellitus with other diabetic kidney complication: Secondary | ICD-10-CM | POA: Diagnosis not present

## 2021-11-25 DIAGNOSIS — E1165 Type 2 diabetes mellitus with hyperglycemia: Secondary | ICD-10-CM | POA: Diagnosis not present

## 2021-11-25 MED ORDER — PIOGLITAZONE HCL 15 MG PO TABS: 15.0000 mg | ORAL_TABLET | Freq: Every day | ORAL | 3 refills | Status: DC

## 2021-11-25 MED ORDER — INSULIN PEN NEEDLE 32G X 4 MM MISC: 1.0000 | Freq: Every day | 3 refills | Status: DC

## 2021-11-25 MED ORDER — METFORMIN HCL ER 500 MG PO TB24: 500.0000 mg | ORAL_TABLET | Freq: Two times a day (BID) | ORAL | 3 refills | Status: DC

## 2021-11-25 MED ORDER — DAPAGLIFLOZIN PROPANEDIOL 10 MG PO TABS: 10.0000 mg | ORAL_TABLET | Freq: Every day | ORAL | 3 refills | Status: DC

## 2021-11-25 MED ORDER — TRESIBA FLEXTOUCH 100 UNIT/ML ~~LOC~~ SOPN: 17.0000 [IU] | PEN_INJECTOR | Freq: Every day | SUBCUTANEOUS | 6 refills | Status: DC

## 2021-11-25 NOTE — Patient Instructions (Addendum)
°-   Continue Metformin 500 mg, 1 tablet before Breakfast and 1 tablet before Dinner - Continue  Farxiga 10 mg, 1 tablet with Breakfast  - Increase Tresiba  17  units daily  - Start Actos 15 mg, 1 tablet daily    HOW TO TREAT LOW BLOOD SUGARS (Blood sugar LESS THAN 70 MG/DL) Please follow the RULE OF 15 for the treatment of hypoglycemia treatment (when your (blood sugars are less than 70 mg/dL)   STEP 1: Take 15 grams of carbohydrates when your blood sugar is low, which includes:  3-4 GLUCOSE TABS  OR 3-4 OZ OF JUICE OR REGULAR SODA OR ONE TUBE OF GLUCOSE GEL    STEP 2: RECHECK blood sugar in 15 MINUTES STEP 3: If your blood sugar is still low at the 15 minute recheck --> then, go back to STEP 1 and treat AGAIN with another 15 grams of carbohydrates

## 2021-11-25 NOTE — Progress Notes (Signed)
Name: Kayla [name]  Age/ Sex: 61 y.o., female   MRN/ DOB: [id number], 11-04-1960     PCP: Rosine Door   Reason for Endocrinology Evaluation: Type 2 Diabetes Mellitus  Initial Endocrine Consultative Visit: 12/11/2019    PATIENT IDENTIFIER: Kayla [name] is a 61 y.o. female with a past medical history of HTN, Dyslipidemia and T2DM. The patient has followed with Endocrinology clinic since 12/11/2019 for consultative assistance with management of her diabetes.  DIABETIC HISTORY:  Kayla [name] was diagnosed with T2 DM  Many years ago. She has been on metformin since her diagnosis.  Her hemoglobin A1c  peaking at 11.3 % in in 2021   On her initial visit to our clinic she had an A1c 11.3%, we reduced Metformin due to diarrhea , we started SU as she declined insulin.    Basal insulin and farxiga started 10/2020  SUBJECTIVE:   During the last visit (05/19/2021): A1c 7.0% . We adjusted tresiba, continued  metformin and  Jardiance       Today (11/25/2021): Kayla [name] is here for a follow up on diabetes management.   She checks her blood sugars 1 times daily.The patient has had not hypoglycemic episodes since the last clinic visit,   Pt admits to dietary indiscretions and exercise  She denies nausea or vomiting or diarrhea     HOME DIABETES REGIMEN:  Farxiga 10 mg, 1 tablet with Breakfast  Tresiba 16 units daily - takes 14 units  Metformin 500 mg 1 tablet BID     Statin: Yes  ACE-I/ARB: Yes    METER DOWNLOAD SUMMARY: unable to download   [id number]   DIABETIC COMPLICATIONS: Microvascular complications:    Denies: retinopathy, neuropathy , CKD  Last eye exam: Completed 2020- has appointment next week        Macrovascular complications:    Denies: CAD, PVD, CVA  HISTORY:  Past Medical History:  Past Medical History:  Diagnosis Date   Alcohol abuse    NONE SINCE 1997   Alcohol-induced pancreatitis    NO ETOH SINCE 1997   Diabetes mellitus  without complication ([place])    Hyperlipidemia    Hypertension    Thyroid disease    Past Surgical History:  Past Surgical History:  Procedure Laterality Date   ABDOMINAL HYSTERECTOMY     DUE TO DUB   CHOLECYSTECTOMY     BILIARY COLIC-NO STONES   Social History:  reports that she has never smoked. She has never used smokeless tobacco. No history on file for alcohol use and drug use. Family History:  Family History  Problem Relation Age of Onset   Dementia Father    Depression Sister      HOME MEDICATIONS: Allergies as of 11/25/2021       Reactions   Codeine Nausea And Vomiting   Tramadol Nausea And Vomiting        Medication List        Accurate as of November 25, 2021  3:08 PM. If you have any questions, ask your nurse or doctor.          STOP taking these medications    ONE TOUCH ULTRA 2 w/Device Kit Stopped by: Dorita Sciara, MD   potassium chloride 10 MEQ CR capsule Commonly known as: MICRO-K Stopped by: Dorita Sciara, MD   zolpidem 10 MG tablet Commonly known as: AMBIEN Stopped by: Dorita Sciara, MD       TAKE these medications  albuterol 108 (90 Base) MCG/ACT inhaler Commonly known as: VENTOLIN HFA SMARTSIG:2 Puff(s) By Mouth Every 4-6 Hours PRN   Belsomra 5 MG Tabs Generic drug: Suvorexant Take 5 mg by mouth at bedtime.   dapagliflozin propanediol 10 MG Tabs tablet Commonly known as: Farxiga Take 1 tablet (10 mg total) by mouth daily before breakfast.   esomeprazole 40 MG capsule Commonly known as: NEXIUM Take 40 mg by mouth daily.   FLUoxetine 40 MG capsule Commonly known as: PROZAC Take 40 mg by mouth daily.   Insulin Pen Needle 32G X 4 MM Misc 1 Device by Does not apply route daily in the afternoon. What changed:  how much to take how to take this when to take this additional instructions Changed by: Dorita Sciara, MD   levothyroxine 50 MCG tablet Commonly known as: SYNTHROID Take 88 mcg by  mouth daily.   levothyroxine 75 MCG tablet Commonly known as: SYNTHROID Take by mouth.   losartan 50 MG tablet Commonly known as: COZAAR Take 50 mg by mouth daily.   metFORMIN 500 MG 24 hr tablet Commonly known as: GLUCOPHAGE-XR Take 1 tablet (500 mg total) by mouth 2 (two) times daily with a meal.   multivitamin with minerals Tabs tablet Take 1 tablet by mouth daily.   OneTouch Delica Lancets 48N Misc USE TO TEST BLOOD GLUCOSE ONCE A DAY   OneTouch Ultra test strip Generic drug: glucose blood USE TO CHECK BLOOD SUGAR ONCE A DAY   pioglitazone 15 MG tablet Commonly known as: Actos Take 1 tablet (15 mg total) by mouth daily. Started by: Dorita Sciara, MD   potassium chloride 10 MEQ tablet Commonly known as: KLOR-CON M Take 10 mEq by mouth daily.   simvastatin 20 MG tablet Commonly known as: ZOCOR Take 20 mg by mouth daily.   Tyler Aas FlexTouch 100 UNIT/ML FlexTouch Pen Generic drug: insulin degludec Inject 17 Units into the skin daily. What changed: how much to take Changed by: Dorita Sciara, MD   VITAMIN D3 PO Take by mouth.         OBJECTIVE:   Vital Signs: BP 132/76 (BP Location: Left Arm, Patient Position: Sitting, Cuff Size: Large)    Pulse 99    Ht 5' 4"  (1.626 m)    Wt 168 lb (76.2 kg)    SpO2 99%    BMI 28.84 kg/m   Wt Readings from Last 3 Encounters:  11/25/21 168 lb (76.2 kg)  05/19/21 165 lb 6.4 oz (75 kg)  01/13/21 170 lb 4 oz (77.2 kg)     Exam: General: Pt appears well and is in NAD  Lungs: Clear with good BS bilat with no rales, rhonchi, or wheezes  Heart: RRR   Abdomen: Normoactive bowel sounds, soft, nontender, without masses or organomegaly palpable  Extremities: No pretibial edema.   Neuro: MS is good with appropriate affect, pt is alert and Ox3    DM foot exam: 11/25/2021   The skin of the feet is intact without sores or ulcerations. The pedal pulses are 2+ on right and 2+ on left. The sensation is intact to a  screening 5.07, 10 gram monofilament bilaterally      DATA REVIEWED:  Lab Results  Component Value Date   HGBA1C 7.0 (A) 05/19/2021   HGBA1C 10.6 (A) 01/13/2021   HGBA1C 12.1 (A) 10/12/2020   Lab Results  Component Value Date   CREATININE 0.80 01/13/2021    Results for Kayla [name] (MRN [id number]) as of  01/14/2021 14:34  Ref. Range 01/13/2021 10:25  Sodium Latest Ref Range: 135 - 145 mEq/L 138  Potassium Latest Ref Range: 3.5 - 5.1 mEq/L 3.6  Chloride Latest Ref Range: 96 - 112 mEq/L 98  CO2 Latest Ref Range: 19 - 32 mEq/L 29  Glucose Latest Ref Range: 70 - 99 mg/dL 128 (H)  BUN Latest Ref Range: 6 - 23 mg/dL 15  Creatinine Latest Ref Range: 0.40 - 1.20 mg/dL 0.80  Calcium Latest Ref Range: 8.4 - 10.5 mg/dL 9.7  GFR Latest Ref Range: >60.00 mL/min 79.28     08/25/2020  Tg 134 HDL 49 T. Cholesterol 123  MA/Cr ratio 227.4  BUN/Cr 13/0.840  ASSESSMENT / PLAN / RECOMMENDATIONS:   1) Type 2 Diabetes Mellitus, Poorly Controlled , With microalbuminuria  complications - Most recent A1c of 9.5 %. Goal A1c < 7.0 %.     - Patient with worsening glycemic control, A1c  increased 7.0%  this is due to dietary indiscretions  - Dexcom not covered, she declines Freestyle libre at this time, she is comfortable with finger sticks  - She has a history of pancreatitis, hence GLP-1 agonist and DPP 4 inhibitors are contraindicated. -Discussed risk of weight gain and lower extremity edema with pioglitazone, she is in agreement and giving this a try    MEDICATIONS: - Continue Metformin 500 mg, 1 tablet before Breakfast and 1 tablet before Dinner - Continue Farxiga 10 mg, 1 tablet with Breakfast  - Increase Tresiba 17 units daily  - Start pioglitazone 15 mg daily    EDUCATION / INSTRUCTIONS: BG monitoring instructions: Patient is instructed to check her blood sugars 2 times a day,fasting and supper   Call [facility] if: BG persistently < 70  I reviewed the Rule  of 15 for the treatment of hypoglycemia in detail with the patient. Literature supplied.    2) Diabetic complications:  Eye: Does not have known diabetic retinopathy.  Neuro/ Feet: Does not have known diabetic peripheral neuropathy .  Renal: Patient does not have known baseline CKD. She   is  on an ACEI/ARB at present. Check      F/U in 3 months    Signed electronically by: Mack Guise, MD  [facility]  [facility] [address] [place], [place] [address] Phone: [phone] FAX: [phone]   CC: Rosine Door [place] [address] Phone: [phone]  Fax: [phone]  Return to Endocrinology clinic as below: Future Appointments  Date Time Provider [place]  02/25/2022 10:30 AM Jenkins Risdon, Melanie Crazier, MD [id number] None

## 2021-12-15 ENCOUNTER — Other Ambulatory Visit: Payer: Self-pay | Admitting: Internal Medicine

## 2021-12-18 ENCOUNTER — Other Ambulatory Visit: Payer: Self-pay | Admitting: Internal Medicine

## 2022-02-25 ENCOUNTER — Ambulatory Visit: Payer: Medicare HMO | Admitting: Internal Medicine

## 2022-04-07 ENCOUNTER — Ambulatory Visit: Payer: Medicare HMO | Admitting: Internal Medicine

## 2022-04-19 ENCOUNTER — Ambulatory Visit: Payer: Medicare HMO | Admitting: Internal Medicine

## 2022-04-27 ENCOUNTER — Other Ambulatory Visit (HOSPITAL_COMMUNITY): Payer: Self-pay | Admitting: Physician Assistant

## 2022-04-27 DIAGNOSIS — Z1231 Encounter for screening mammogram for malignant neoplasm of breast: Secondary | ICD-10-CM

## 2022-05-30 ENCOUNTER — Ambulatory Visit (HOSPITAL_COMMUNITY): Payer: Medicare HMO

## 2022-06-10 ENCOUNTER — Ambulatory Visit (HOSPITAL_COMMUNITY): Payer: Medicare HMO

## 2022-06-24 ENCOUNTER — Ambulatory Visit (HOSPITAL_COMMUNITY)
Admission: RE | Admit: 2022-06-24 | Discharge: 2022-06-24 | Disposition: A | Discharge status: Home / Self Care | Payer: Medicare Other | Source: Ambulatory Visit | Attending: Physician Assistant | Admitting: Physician Assistant

## 2022-06-24 DIAGNOSIS — Z1231 Encounter for screening mammogram for malignant neoplasm of breast: Secondary | ICD-10-CM | POA: Diagnosis present

## 2022-07-07 ENCOUNTER — Ambulatory Visit: Payer: Medicare HMO | Admitting: Internal Medicine

## 2022-09-01 ENCOUNTER — Encounter: Payer: Self-pay | Admitting: Internal Medicine

## 2022-09-01 ENCOUNTER — Ambulatory Visit (INDEPENDENT_AMBULATORY_CARE_PROVIDER_SITE_OTHER): Payer: Medicare Other | Admitting: Internal Medicine

## 2022-09-01 VITALS — BP 120/76 | HR 84 | Ht 64.0 in | Wt 171.0 lb

## 2022-09-01 DIAGNOSIS — E1165 Type 2 diabetes mellitus with hyperglycemia: Secondary | ICD-10-CM

## 2022-09-01 LAB — POCT GLYCOSYLATED HEMOGLOBIN (HGB A1C): Hemoglobin A1C: 9.1 % — AB (ref 4.0–5.6)

## 2022-09-01 MED ORDER — TRESIBA FLEXTOUCH 100 UNIT/ML ~~LOC~~ SOPN: 20.0000 [IU] | PEN_INJECTOR | Freq: Every day | SUBCUTANEOUS | 3 refills | Status: DC

## 2022-09-01 MED ORDER — PIOGLITAZONE HCL 30 MG PO TABS: 30.0000 mg | ORAL_TABLET | Freq: Every day | ORAL | 3 refills | Status: DC

## 2022-09-01 MED ORDER — INSULIN PEN NEEDLE 32G X 4 MM MISC: 1.0000 | Freq: Every day | 3 refills | Status: DC

## 2022-09-01 MED ORDER — DAPAGLIFLOZIN PROPANEDIOL 10 MG PO TABS: 10.0000 mg | ORAL_TABLET | Freq: Every day | ORAL | 3 refills | Status: DC

## 2022-09-01 MED ORDER — METFORMIN HCL ER 500 MG PO TB24: 500.0000 mg | ORAL_TABLET | Freq: Two times a day (BID) | ORAL | 3 refills | Status: DC

## 2022-09-01 NOTE — Patient Instructions (Signed)
-   Continue Metformin 500 mg, 1 tablet before Breakfast and 1 tablet before Dinner - Continue  Farxiga 10 mg, 1 tablet with Breakfast  - Increase Tresiba  20  units daily  - Increase Actos 30 mg, 1 tablet daily    HOW TO TREAT LOW BLOOD SUGARS (Blood sugar LESS THAN 70 MG/DL) Please follow the RULE OF 15 for the treatment of hypoglycemia treatment (when your (blood sugars are less than 70 mg/dL)   STEP 1: Take 15 grams of carbohydrates when your blood sugar is low, which includes:  3-4 GLUCOSE TABS  OR 3-4 OZ OF JUICE OR REGULAR SODA OR ONE TUBE OF GLUCOSE GEL    STEP 2: RECHECK blood sugar in 15 MINUTES STEP 3: If your blood sugar is still low at the 15 minute recheck --> then, go back to STEP 1 and treat AGAIN with another 15 grams of carbohydrates

## 2022-09-01 NOTE — Progress Notes (Signed)
Name: Kayla [name]  Age/ Sex: 61 y.o., female   MRN/ DOB: [id number], 1960/11/14     PCP: Sheela Stack   Reason for Endocrinology Evaluation: Type 2 Diabetes Mellitus  Initial Endocrine Consultative Visit: 12/11/2019    PATIENT IDENTIFIER: Kayla [name] is a 61 y.o. female with a past medical history of HTN, Dyslipidemia and T2DM. The patient has followed with Endocrinology clinic since 12/11/2019 for consultative assistance with management of her diabetes.  DIABETIC HISTORY:  Ms. Stankus was diagnosed with T2 DM  Many years ago. She has been on metformin since her diagnosis.  Her hemoglobin A1c  peaking at 11.3 % in in 2021   On her initial visit to our clinic she had an A1c 11.3%, we reduced Metformin due to diarrhea , we started SU as she declined insulin.    Basal insulin and farxiga started 10/2020 Started pioglitazone 11/2021  SUBJECTIVE:   During the last visit (11/25/2021): A1c 9.5 % .     Today (09/01/2022): Kayla [name] is here for a follow up on diabetes management.   She checks her blood sugars 1 times daily.The patient has had not hypoglycemic episodes since the last clinic visit,   Pt admits to continuing to drink sweet tea and regular soda She had gastritis over thanksgiving holiday but this has resolved   She has been out of tresiba 3 days ago but she did not go to the pharmacy to pick it up  She has been depressed , pt to see her PCP for this     HOME DIABETES REGIMEN:  Farxiga 10 mg, 1 tablet with Breakfast  Tresiba 17 units daily  Metformin 500 mg 1 tablet BID Pioglitazone 15 mg daily    Statin: Yes  ACE-I/ARB: Yes    METER DOWNLOAD SUMMARY: unable to download   135-326 mg/dL    DIABETIC COMPLICATIONS: Microvascular complications:    Denies: retinopathy, neuropathy , CKD  Last eye exam: Completed 12/2021   Macrovascular complications:    Denies: CAD, PVD, CVA  HISTORY:  Past Medical History:  Past Medical  History:  Diagnosis Date   Alcohol abuse    NONE SINCE 1997   Alcohol-induced pancreatitis    NO ETOH SINCE 1997   Diabetes mellitus without complication ([facility])    Hyperlipidemia    Hypertension    Thyroid disease    Past Surgical History:  Past Surgical History:  Procedure Laterality Date   ABDOMINAL HYSTERECTOMY     DUE TO DUB   CHOLECYSTECTOMY     BILIARY COLIC-NO STONES   Social History:  reports that she has never smoked. She has never used smokeless tobacco. No history on file for alcohol use and drug use. Family History:  Family History  Problem Relation Age of Onset   Dementia Father    Depression Sister      HOME MEDICATIONS: Allergies as of 09/01/2022       Reactions   Codeine Nausea And Vomiting   Tramadol Nausea And Vomiting        Medication List        Accurate as of September 01, 2022  8:00 AM. If you have any questions, ask your nurse or doctor.          albuterol 108 (90 Base) MCG/ACT inhaler Commonly known as: VENTOLIN HFA SMARTSIG:2 Puff(s) By Mouth Every 4-6 Hours PRN   Belsomra 5 MG Tabs Generic drug: Suvorexant Take 5 mg by mouth at bedtime.  dapagliflozin propanediol 10 MG Tabs tablet Commonly known as: Farxiga Take 1 tablet (10 mg total) by mouth daily before breakfast.   esomeprazole 40 MG capsule Commonly known as: NEXIUM Take 40 mg by mouth daily.   FLUoxetine 40 MG capsule Commonly known as: PROZAC Take 40 mg by mouth daily.   Insulin Pen Needle 32G X 4 MM Misc 1 Device by Does not apply route daily in the afternoon.   levothyroxine 50 MCG tablet Commonly known as: SYNTHROID Take 88 mcg by mouth daily.   levothyroxine 75 MCG tablet Commonly known as: SYNTHROID Take by mouth.   losartan 50 MG tablet Commonly known as: COZAAR Take 50 mg by mouth daily.   metFORMIN 500 MG 24 hr tablet Commonly known as: GLUCOPHAGE-XR Take 1 tablet (500 mg total) by mouth 2 (two) times daily with a meal.   multivitamin with  minerals Tabs tablet Take 1 tablet by mouth daily.   OneTouch Delica Lancets 33G Misc USE TO TEST BLOOD GLUCOSE ONCE A DAY   OneTouch Ultra test strip Generic drug: glucose blood USE TO CHECK BLOOD SUGAR ONCE A DAY   pioglitazone 15 MG tablet Commonly known as: Actos Take 1 tablet (15 mg total) by mouth daily.   potassium chloride 10 MEQ tablet Commonly known as: KLOR-CON M Take 10 mEq by mouth daily.   simvastatin 20 MG tablet Commonly known as: ZOCOR Take 20 mg by mouth daily.   Evaristo Bury FlexTouch 100 UNIT/ML FlexTouch Pen Generic drug: insulin degludec Inject 17 Units into the skin daily.   VITAMIN D3 PO Take by mouth.         OBJECTIVE:   Vital Signs: BP 120/76 (BP Location: Left Arm, Patient Position: Sitting, Cuff Size: Large)   Pulse 84   Ht 5\' 4"  (1.626 m)   Wt 171 lb (77.6 kg)   SpO2 97%   BMI 29.35 kg/m   Wt Readings from Last 3 Encounters:  09/01/22 171 lb (77.6 kg)  11/25/21 168 lb (76.2 kg)  05/19/21 165 lb 6.4 oz (75 kg)     Exam: General: Pt appears well and is in NAD  Lungs: Clear with good BS bilat with no rales, rhonchi, or wheezes  Heart: RRR   Abdomen: soft, nontender, without masses or organomegaly palpable  Extremities: No pretibial edema.   Neuro: MS is good with appropriate affect, pt is alert and Ox3    DM foot exam: 11/25/2021   The skin of the feet is intact without sores or ulcerations. The pedal pulses are 2+ on right and 2+ on left. The sensation is intact to a screening 5.07, 10 gram monofilament bilaterally      DATA REVIEWED:  Lab Results  Component Value Date   HGBA1C 9.1 (A) 09/01/2022   HGBA1C 7.0 (A) 05/19/2021   HGBA1C 10.6 (A) 01/13/2021    04/15/2022 BUN 17 Cr 0.92 GFR 70 HDL 46 LDL 38   ASSESSMENT / PLAN / RECOMMENDATIONS:   1) Type 2 Diabetes Mellitus, Poorly Controlled , With microalbuminuria  complications - Most recent A1c of 9.1 %. Goal A1c < 7.0 %.     - Patient with worsening  glycemic control, due to dietary indiscretions and medication non-adherence  - Barriers to diabetes self care is depression, pt urged to address this with PCP - Dexcom not covered, she declined Freestyle libre  - She has a history of pancreatitis, hence GLP-1 agonist and DPP 4 inhibitors are contraindicated. - Will increase insulin and pioglitazone as below  MEDICATIONS: - Continue Metformin 500 mg, 1 tablet before Breakfast and 1 tablet before Dinner - Continue Farxiga 10 mg, 1 tablet with Breakfast  - Increase Tresiba 20 units daily  - Increase pioglitazone 30 mg daily    EDUCATION / INSTRUCTIONS: BG monitoring instructions: Patient is instructed to check her blood sugars 2 times a day,fasting and supper   Call [facility] if: BG persistently < 70  I reviewed the Rule of 15 for the treatment of hypoglycemia in detail with the patient. Literature supplied.    2) Diabetic complications:  Eye: Does not have known diabetic retinopathy.  Neuro/ Feet: Does not have known diabetic peripheral neuropathy .  Renal: Patient does not have known baseline CKD. She   is  on an ACEI/ARB at present. Check      F/U in 6 months    Signed electronically by: Lyndle Herrlich, MD  [facility]  [facility] [address] [place] [place], [place] [address] Phone: [phone] FAX: [phone]   CC: Sheela Stack [address] [place] [address] Phone: [phone]  Fax: [phone]  Return to Endocrinology clinic as below: No future appointments.

## 2022-11-22 ENCOUNTER — Other Ambulatory Visit: Payer: Self-pay | Admitting: Internal Medicine

## 2022-11-25 DIAGNOSIS — E119 Type 2 diabetes mellitus without complications: Secondary | ICD-10-CM | POA: Diagnosis not present

## 2022-11-25 DIAGNOSIS — H2513 Age-related nuclear cataract, bilateral: Secondary | ICD-10-CM | POA: Diagnosis not present

## 2022-11-25 DIAGNOSIS — H524 Presbyopia: Secondary | ICD-10-CM | POA: Diagnosis not present

## 2022-12-29 DIAGNOSIS — E039 Hypothyroidism, unspecified: Secondary | ICD-10-CM | POA: Diagnosis not present

## 2022-12-29 DIAGNOSIS — E559 Vitamin D deficiency, unspecified: Secondary | ICD-10-CM | POA: Diagnosis not present

## 2022-12-29 DIAGNOSIS — I10 Essential (primary) hypertension: Secondary | ICD-10-CM | POA: Diagnosis not present

## 2022-12-29 DIAGNOSIS — K219 Gastro-esophageal reflux disease without esophagitis: Secondary | ICD-10-CM | POA: Diagnosis not present

## 2022-12-29 DIAGNOSIS — R5383 Other fatigue: Secondary | ICD-10-CM | POA: Diagnosis not present

## 2022-12-29 DIAGNOSIS — D649 Anemia, unspecified: Secondary | ICD-10-CM | POA: Diagnosis not present

## 2022-12-29 DIAGNOSIS — E876 Hypokalemia: Secondary | ICD-10-CM | POA: Diagnosis not present

## 2022-12-29 DIAGNOSIS — E1165 Type 2 diabetes mellitus with hyperglycemia: Secondary | ICD-10-CM | POA: Diagnosis not present

## 2023-01-05 DIAGNOSIS — E039 Hypothyroidism, unspecified: Secondary | ICD-10-CM | POA: Diagnosis not present

## 2023-01-05 DIAGNOSIS — I10 Essential (primary) hypertension: Secondary | ICD-10-CM | POA: Diagnosis not present

## 2023-01-05 DIAGNOSIS — G4700 Insomnia, unspecified: Secondary | ICD-10-CM | POA: Diagnosis not present

## 2023-01-05 DIAGNOSIS — E1165 Type 2 diabetes mellitus with hyperglycemia: Secondary | ICD-10-CM | POA: Diagnosis not present

## 2023-03-02 ENCOUNTER — Encounter: Payer: Self-pay | Admitting: Internal Medicine

## 2023-03-02 ENCOUNTER — Ambulatory Visit (INDEPENDENT_AMBULATORY_CARE_PROVIDER_SITE_OTHER): Payer: 59 | Admitting: Internal Medicine

## 2023-03-02 VITALS — BP 132/84 | HR 89 | Ht 64.0 in | Wt 181.0 lb

## 2023-03-02 DIAGNOSIS — Z794 Long term (current) use of insulin: Secondary | ICD-10-CM | POA: Diagnosis not present

## 2023-03-02 DIAGNOSIS — Z7984 Long term (current) use of oral hypoglycemic drugs: Secondary | ICD-10-CM | POA: Diagnosis not present

## 2023-03-02 DIAGNOSIS — E1165 Type 2 diabetes mellitus with hyperglycemia: Secondary | ICD-10-CM | POA: Diagnosis not present

## 2023-03-02 DIAGNOSIS — E1129 Type 2 diabetes mellitus with other diabetic kidney complication: Secondary | ICD-10-CM

## 2023-03-02 LAB — MICROALBUMIN / CREATININE URINE RATIO
Creatinine,U: 41.7 mg/dL
Microalb Creat Ratio: 1.7 mg/g (ref 0.0–30.0)
Microalb, Ur: <0.7 mg/dL (ref 0.0–1.9)

## 2023-03-02 LAB — POCT GLYCOSYLATED HEMOGLOBIN (HGB A1C): Hemoglobin A1C: 7.5 % — AB (ref 4.0–5.6)

## 2023-03-02 MED ORDER — GLIPIZIDE 5 MG PO TABS: 5.0000 mg | ORAL_TABLET | Freq: Every day | ORAL | 3 refills | Status: DC

## 2023-03-02 MED ORDER — DAPAGLIFLOZIN PROPANEDIOL 10 MG PO TABS: 10.0000 mg | ORAL_TABLET | Freq: Every day | ORAL | 3 refills | Status: DC

## 2023-03-02 MED ORDER — TRESIBA FLEXTOUCH 100 UNIT/ML ~~LOC~~ SOPN: 20.0000 [IU] | PEN_INJECTOR | Freq: Every day | SUBCUTANEOUS | 3 refills | Status: DC

## 2023-03-02 MED ORDER — INSULIN PEN NEEDLE 32G X 4 MM MISC: 1.0000 | Freq: Every day | 3 refills | Status: DC

## 2023-03-02 MED ORDER — METFORMIN HCL ER 500 MG PO TB24: 500.0000 mg | ORAL_TABLET | Freq: Two times a day (BID) | ORAL | 3 refills | Status: DC

## 2023-03-02 NOTE — Patient Instructions (Signed)
-   Stop Pioglitazone  - Start Glipizide 5 mg, 1 tablet before Breakfast  - Continue Metformin 500 mg, 1 tablet before Breakfast and 1 tablet before Dinner - Continue  Farxiga 10 mg, 1 tablet with Breakfast  - Continue Tresiba  20  units daily     HOW TO TREAT LOW BLOOD SUGARS (Blood sugar LESS THAN 70 MG/DL) Please follow the RULE OF 15 for the treatment of hypoglycemia treatment (when your (blood sugars are less than 70 mg/dL)   STEP 1: Take 15 grams of carbohydrates when your blood sugar is low, which includes:  3-4 GLUCOSE TABS  OR 3-4 OZ OF JUICE OR REGULAR SODA OR ONE TUBE OF GLUCOSE GEL    STEP 2: RECHECK blood sugar in 15 MINUTES STEP 3: If your blood sugar is still low at the 15 minute recheck --> then, go back to STEP 1 and treat AGAIN with another 15 grams of carbohydrates

## 2023-03-02 NOTE — Progress Notes (Signed)
Name: Kayla [name]  Age/ Sex: 62 y.o., female   MRN/ DOB: [id number], Aug 02, 1961     PCP: Sheela Stack   Reason for Endocrinology Evaluation: Type 2 Diabetes Mellitus  Initial Endocrine Consultative Visit: 12/11/2019    PATIENT IDENTIFIER: Kayla [name] is a 62 y.o. female with a past medical history of HTN, Dyslipidemia and T2DM. The patient has followed with Endocrinology clinic since 12/11/2019 for consultative assistance with management of her diabetes.  DIABETIC HISTORY:  Kayla [name] was diagnosed with T2 DM  Many years ago. She has been on metformin since her diagnosis.  Her hemoglobin A1c  peaking at 11.3 % in in 2021   On her initial visit to our clinic she had an A1c 11.3%, we reduced Metformin due to diarrhea , we started SU as she declined insulin.    Basal insulin and farxiga started 10/2020 Started pioglitazone 11/2021   Microalbuminuria resolved 02/2023  SUBJECTIVE:   During the last visit (09/01/2022): A1c 9.1 % .     Today (03/02/2023): Kayla [name] is here for a follow up on diabetes management.   She checks her blood sugars 1 times daily.The patient has had not hypoglycemic episodes since the last clinic visit,   Has noted weight gain  She eats Breakfast and Lunch and snacks instead of supper  Denies nausea or vomiting  Denies constipation or diarrhea Denies LE edema   HOME DIABETES REGIMEN:  Farxiga 10 mg, 1 tablet with Breakfast  Tresiba 20 units daily  Metformin 500 mg 1 tablet BID Pioglitazone 30 mg daily    Statin: Yes  ACE-I/ARB: Yes    METER DOWNLOAD SUMMARY: unable to download   110- 282 mg/dL    DIABETIC COMPLICATIONS: Microvascular complications:    Denies: retinopathy, neuropathy , CKD  Last eye exam: Completed 11/25/2022   Macrovascular complications:    Denies: CAD, PVD, CVA  HISTORY:  Past Medical History:  Past Medical History:  Diagnosis Date   Alcohol abuse    NONE SINCE 1997    Alcohol-induced pancreatitis    NO ETOH SINCE 1997   Diabetes mellitus without complication (HCC)    Hyperlipidemia    Hypertension    Thyroid disease    Past Surgical History:  Past Surgical History:  Procedure Laterality Date   ABDOMINAL HYSTERECTOMY     DUE TO DUB   CHOLECYSTECTOMY     BILIARY COLIC-NO STONES   Social History:  reports that she has never smoked. She has never used smokeless tobacco. No history on file for alcohol use and drug use. Family History:  Family History  Problem Relation Age of Onset   Dementia Father    Depression Sister      HOME MEDICATIONS: Allergies as of 03/02/2023       Reactions   Codeine Nausea And Vomiting   Tramadol Nausea And Vomiting        Medication List        Accurate as of Mar 02, 2023  9:03 AM. If you have any questions, ask your nurse or doctor.          STOP taking these medications    losartan 50 MG tablet Commonly known as: COZAAR Stopped by: Kayla Shorts, MD       TAKE these medications    albuterol 108 (90 Base) MCG/ACT inhaler Commonly known as: VENTOLIN HFA SMARTSIG:2 Puff(s) By Mouth Every 4-6 Hours PRN   Belsomra 5 MG Tabs Generic drug: Suvorexant Take  5 mg by mouth at bedtime.   dapagliflozin propanediol 10 MG Tabs tablet Commonly known as: Farxiga Take 1 tablet (10 mg total) by mouth daily before breakfast.   esomeprazole 40 MG capsule Commonly known as: NEXIUM Take 40 mg by mouth daily.   FLUoxetine 40 MG capsule Commonly known as: PROZAC Take 40 mg by mouth daily.   Insulin Pen Needle 32G X 4 MM Misc 1 Device by Does not apply route daily in the afternoon.   levothyroxine 88 MCG tablet Commonly known as: SYNTHROID Take 88 mcg by mouth daily before breakfast. What changed: Another medication with the same name was removed. Continue taking this medication, and follow the directions you see here. Changed by: Kayla Shorts, MD   losartan-hydrochlorothiazide  50-12.5 MG tablet Commonly known as: HYZAAR Take 1 tablet by mouth daily.   metFORMIN 500 MG 24 hr tablet Commonly known as: GLUCOPHAGE-XR Take 1 tablet (500 mg total) by mouth 2 (two) times daily with a meal.   multivitamin with minerals Tabs tablet Take 1 tablet by mouth daily.   OneTouch Delica Lancets 33G Misc USE TO TEST BLOOD GLUCOSE ONCE A DAY   OneTouch Ultra test strip Generic drug: glucose blood USE TO CHECK BLOOD SUGAR ONCE A DAY   pioglitazone 30 MG tablet Commonly known as: Actos Take 1 tablet (30 mg total) by mouth daily.   potassium chloride 10 MEQ tablet Commonly known as: KLOR-CON M Take 10 mEq by mouth daily.   simvastatin 20 MG tablet Commonly known as: ZOCOR Take 20 mg by mouth daily.   Evaristo Bury FlexTouch 100 UNIT/ML FlexTouch Pen Generic drug: insulin degludec Inject 20 Units into the skin daily.   VITAMIN D3 PO Take by mouth. 3 times a week         OBJECTIVE:   Vital Signs: BP 132/84 (BP Location: Left Arm, Patient Position: Sitting, Cuff Size: Large)   Pulse 89   Ht 5\' 4"  (1.626 m)   Wt 181 lb (82.1 kg)   SpO2 96%   BMI 31.07 kg/m   Wt Readings from Last 3 Encounters:  03/02/23 181 lb (82.1 kg)  09/01/22 171 lb (77.6 kg)  11/25/21 168 lb (76.2 kg)     Exam: General: Pt appears well and is in NAD  Lungs: Clear with good BS bilat   Heart: RRR   Abdomen: soft, nontender  Extremities: No pretibial edema.   Neuro: MS is good with appropriate affect, pt is alert and Ox3    DM foot exam: 03/02/2023   The skin of the feet is intact without sores or ulcerations. The pedal pulses are 2+ on right and 2+ on left. The sensation is intact to a screening 5.07, 10 gram monofilament bilaterally      DATA REVIEWED:  Lab Results  Component Value Date   HGBA1C 7.5 (A) 03/02/2023   HGBA1C 9.1 (A) 09/01/2022   HGBA1C 7.0 (A) 05/19/2021    Latest Reference Range & Units 03/02/23 09:55  Creatinine,U mg/dL 16.1  Microalb, Ur 0.0 - 1.9  mg/dL <0.9  MICROALB/CREAT RATIO 0.0 - 30.0 mg/g 1.7    04/15/2022 BUN 17 Cr 0.92 GFR 70 HDL 46 LDL 38   ASSESSMENT / PLAN / RECOMMENDATIONS:   1) Type 2 Diabetes Mellitus, Sub- Optimally  Controlled , Without microalbuminuria  complications - Most recent A1c of 7.5 %. Goal A1c < 7.0 %.    - A1c has improved  - Pt would like to discontinue Pioglitazone due to weight  gain  - Encouraged exercise - Dexcom not covered, she declined Freestyle libre in the past - She has a history of pancreatitis, hence GLP-1 agonist and DPP 4 inhibitors are contraindicated. -She had recent labs done through her PCPs office which were reviewed she is missing MA/CR ratio which was done in the office today which is normal   MEDICATIONS: - Continue Metformin 500 mg, 1 tablet before Breakfast and 1 tablet before Dinner - Continue Farxiga 10 mg, 1 tablet with Breakfast  - Continue  Tresiba 20 units daily  - Stop pioglitazone 30 mg daily  - Start Glipizide 5 mg, 1 tablet before Breakfast    EDUCATION / INSTRUCTIONS: BG monitoring instructions: Patient is instructed to check her blood sugars 2 times a day,fasting and supper   Call [place] [facility] if: BG persistently < 70  I reviewed the Rule of 15 for the treatment of hypoglycemia in detail with the patient. Literature supplied.    2) Diabetic complications:  Eye: Does not have known diabetic retinopathy.  Neuro/ Feet: Does not have known diabetic peripheral neuropathy .  Renal: Patient does not have known baseline CKD. She   is  on an ACEI/ARB at present. Check    3) Microalbuminuria:  -Resolved as of labs 02/2023  F/U in 6 months    Signed electronically by: Lyndle Herrlich, MD  [facility] Endocrinology  [facility] [address] [place] [place], [place] [address] Phone: [phone] FAX: [phone]   CC: Sheela Stack [address] [place] [address] Phone: [phone]  Fax:  [phone]  Return to Endocrinology clinic as below: No future appointments.

## 2023-04-21 DIAGNOSIS — K219 Gastro-esophageal reflux disease without esophagitis: Secondary | ICD-10-CM | POA: Diagnosis not present

## 2023-04-21 DIAGNOSIS — E559 Vitamin D deficiency, unspecified: Secondary | ICD-10-CM | POA: Diagnosis not present

## 2023-04-21 DIAGNOSIS — E1165 Type 2 diabetes mellitus with hyperglycemia: Secondary | ICD-10-CM | POA: Diagnosis not present

## 2023-04-21 DIAGNOSIS — D649 Anemia, unspecified: Secondary | ICD-10-CM | POA: Diagnosis not present

## 2023-04-21 DIAGNOSIS — E876 Hypokalemia: Secondary | ICD-10-CM | POA: Diagnosis not present

## 2023-04-28 DIAGNOSIS — E559 Vitamin D deficiency, unspecified: Secondary | ICD-10-CM | POA: Diagnosis not present

## 2023-04-28 DIAGNOSIS — R634 Abnormal weight loss: Secondary | ICD-10-CM | POA: Diagnosis not present

## 2023-04-28 DIAGNOSIS — I10 Essential (primary) hypertension: Secondary | ICD-10-CM | POA: Diagnosis not present

## 2023-04-28 DIAGNOSIS — E039 Hypothyroidism, unspecified: Secondary | ICD-10-CM | POA: Diagnosis not present

## 2023-04-28 DIAGNOSIS — G4700 Insomnia, unspecified: Secondary | ICD-10-CM | POA: Diagnosis not present

## 2023-04-28 DIAGNOSIS — E876 Hypokalemia: Secondary | ICD-10-CM | POA: Diagnosis not present

## 2023-04-28 DIAGNOSIS — K219 Gastro-esophageal reflux disease without esophagitis: Secondary | ICD-10-CM | POA: Diagnosis not present

## 2023-04-28 DIAGNOSIS — E1165 Type 2 diabetes mellitus with hyperglycemia: Secondary | ICD-10-CM | POA: Diagnosis not present

## 2023-04-28 DIAGNOSIS — D649 Anemia, unspecified: Secondary | ICD-10-CM | POA: Diagnosis not present

## 2023-05-23 ENCOUNTER — Other Ambulatory Visit: Payer: Self-pay | Admitting: Internal Medicine

## 2023-06-09 ENCOUNTER — Other Ambulatory Visit (HOSPITAL_COMMUNITY): Payer: Self-pay | Admitting: Physician Assistant

## 2023-06-09 DIAGNOSIS — Z1231 Encounter for screening mammogram for malignant neoplasm of breast: Secondary | ICD-10-CM

## 2023-06-28 ENCOUNTER — Ambulatory Visit (HOSPITAL_COMMUNITY): Payer: 59

## 2023-07-05 ENCOUNTER — Ambulatory Visit (HOSPITAL_COMMUNITY)
Admission: RE | Admit: 2023-07-05 | Discharge: 2023-07-05 | Disposition: A | Discharge status: Home / Self Care | Payer: 59 | Source: Ambulatory Visit | Attending: Physician Assistant | Admitting: Physician Assistant

## 2023-07-05 DIAGNOSIS — Z1231 Encounter for screening mammogram for malignant neoplasm of breast: Secondary | ICD-10-CM | POA: Insufficient documentation

## 2023-08-10 DIAGNOSIS — E039 Hypothyroidism, unspecified: Secondary | ICD-10-CM | POA: Diagnosis not present

## 2023-08-10 DIAGNOSIS — E559 Vitamin D deficiency, unspecified: Secondary | ICD-10-CM | POA: Diagnosis not present

## 2023-08-10 DIAGNOSIS — E1165 Type 2 diabetes mellitus with hyperglycemia: Secondary | ICD-10-CM | POA: Diagnosis not present

## 2023-08-10 DIAGNOSIS — E876 Hypokalemia: Secondary | ICD-10-CM | POA: Diagnosis not present

## 2023-08-17 DIAGNOSIS — K219 Gastro-esophageal reflux disease without esophagitis: Secondary | ICD-10-CM | POA: Diagnosis not present

## 2023-08-17 DIAGNOSIS — I10 Essential (primary) hypertension: Secondary | ICD-10-CM | POA: Diagnosis not present

## 2023-08-17 DIAGNOSIS — E1165 Type 2 diabetes mellitus with hyperglycemia: Secondary | ICD-10-CM | POA: Diagnosis not present

## 2023-08-17 DIAGNOSIS — E039 Hypothyroidism, unspecified: Secondary | ICD-10-CM | POA: Diagnosis not present

## 2023-08-17 DIAGNOSIS — E559 Vitamin D deficiency, unspecified: Secondary | ICD-10-CM | POA: Diagnosis not present

## 2023-08-17 DIAGNOSIS — D649 Anemia, unspecified: Secondary | ICD-10-CM | POA: Diagnosis not present

## 2023-08-17 DIAGNOSIS — E876 Hypokalemia: Secondary | ICD-10-CM | POA: Diagnosis not present

## 2023-08-17 DIAGNOSIS — G4700 Insomnia, unspecified: Secondary | ICD-10-CM | POA: Diagnosis not present

## 2023-08-17 DIAGNOSIS — E785 Hyperlipidemia, unspecified: Secondary | ICD-10-CM | POA: Diagnosis not present

## 2023-08-21 DIAGNOSIS — Z0001 Encounter for general adult medical examination with abnormal findings: Secondary | ICD-10-CM | POA: Diagnosis not present

## 2023-08-21 DIAGNOSIS — I10 Essential (primary) hypertension: Secondary | ICD-10-CM | POA: Diagnosis not present

## 2023-08-21 DIAGNOSIS — E039 Hypothyroidism, unspecified: Secondary | ICD-10-CM | POA: Diagnosis not present

## 2023-08-21 DIAGNOSIS — E1165 Type 2 diabetes mellitus with hyperglycemia: Secondary | ICD-10-CM | POA: Diagnosis not present

## 2023-08-21 DIAGNOSIS — E785 Hyperlipidemia, unspecified: Secondary | ICD-10-CM | POA: Diagnosis not present

## 2023-08-21 DIAGNOSIS — D649 Anemia, unspecified: Secondary | ICD-10-CM | POA: Diagnosis not present

## 2023-09-07 ENCOUNTER — Ambulatory Visit: Payer: 59 | Admitting: Internal Medicine

## 2023-09-14 ENCOUNTER — Other Ambulatory Visit: Payer: Self-pay | Admitting: Internal Medicine

## 2023-11-17 ENCOUNTER — Encounter: Payer: Self-pay | Admitting: Internal Medicine

## 2023-11-17 ENCOUNTER — Ambulatory Visit: Payer: 59 | Admitting: Internal Medicine

## 2023-11-17 VITALS — BP 126/80 | HR 100 | Ht 64.0 in | Wt 172.0 lb

## 2023-11-17 DIAGNOSIS — Z7984 Long term (current) use of oral hypoglycemic drugs: Secondary | ICD-10-CM

## 2023-11-17 DIAGNOSIS — E1165 Type 2 diabetes mellitus with hyperglycemia: Secondary | ICD-10-CM | POA: Diagnosis not present

## 2023-11-17 DIAGNOSIS — Z794 Long term (current) use of insulin: Secondary | ICD-10-CM | POA: Diagnosis not present

## 2023-11-17 LAB — POCT GLYCOSYLATED HEMOGLOBIN (HGB A1C): Hemoglobin A1C: 7.7 % — AB (ref 4.0–5.6)

## 2023-11-17 MED ORDER — METFORMIN HCL ER 500 MG PO TB24: 500.0000 mg | ORAL_TABLET | Freq: Two times a day (BID) | ORAL | 3 refills | Status: DC

## 2023-11-17 MED ORDER — DAPAGLIFLOZIN PROPANEDIOL 10 MG PO TABS: 10.0000 mg | ORAL_TABLET | Freq: Every day | ORAL | 3 refills | Status: DC

## 2023-11-17 MED ORDER — INSULIN PEN NEEDLE 32G X 4 MM MISC: 1.0000 | Freq: Every day | 3 refills | Status: DC

## 2023-11-17 MED ORDER — GLIPIZIDE 5 MG PO TABS: 5.0000 mg | ORAL_TABLET | Freq: Two times a day (BID) | ORAL | 3 refills | Status: DC

## 2023-11-17 MED ORDER — TRESIBA FLEXTOUCH 100 UNIT/ML ~~LOC~~ SOPN: 16.0000 [IU] | PEN_INJECTOR | Freq: Every day | SUBCUTANEOUS | 4 refills | Status: DC

## 2023-11-17 NOTE — Patient Instructions (Signed)
-   Increase Glipizide 5 mg, 1 tablet before Breakfast  - Continue Metformin 500 mg, 1 tablet before Breakfast and 1 tablet before Dinner - Continue  Farxiga 10 mg, 1 tablet with Breakfast  - Decrease Tresiba  16  units daily     HOW TO TREAT LOW BLOOD SUGARS (Blood sugar LESS THAN 70 MG/DL) Please follow the RULE OF 15 for the treatment of hypoglycemia treatment (when your (blood sugars are less than 70 mg/dL)   STEP 1: Take 15 grams of carbohydrates when your blood sugar is low, which includes:  3-4 GLUCOSE TABS  OR 3-4 OZ OF JUICE OR REGULAR SODA OR ONE TUBE OF GLUCOSE GEL    STEP 2: RECHECK blood sugar in 15 MINUTES STEP 3: If your blood sugar is still low at the 15 minute recheck --> then, go back to STEP 1 and treat AGAIN with another 15 grams of carbohydrates

## 2023-11-17 NOTE — Progress Notes (Signed)
Name: Kayla [name]  Age/ Sex: 63 y.o., female   MRN/ DOB: [id number], Jul 05, 1961     PCP: Sheela Stack   Reason for Endocrinology Evaluation: Type 2 Diabetes Mellitus  Initial Endocrine Consultative Visit: 12/11/2019    PATIENT IDENTIFIER: Kayla [name] is a 63 y.o. female with a past medical history of HTN, Dyslipidemia and T2DM. The patient has followed with Endocrinology clinic since 12/11/2019 for consultative assistance with management of her diabetes.  DIABETIC HISTORY:  Kayla [name] was diagnosed with T2 DM  Many years ago. She has been on metformin since her diagnosis.  Her hemoglobin A1c  peaking at 11.3 % in in 2021   On her initial visit to our clinic she had an A1c 11.3%, we reduced Metformin due to diarrhea , we started SU as she declined insulin.    Basal insulin and farxiga started 10/2020 Started pioglitazone 11/2021, but she would like to discontinue this by 02/2023 due to weight gain, she was started on glipizide   Microalbuminuria resolved 02/2023  SUBJECTIVE:   During the last visit (03/02/2023): A1c 7.5% .     Today (11/17/2023): Kayla [name] is here for a follow up on diabetes management.   She checks her blood sugars occasionally.The patient has had hypoglycemic episodes since the last clinic visit,   Denies nausea or vomiting  Denies constipation or diarrhea Denies LE edema    HOME DIABETES REGIMEN:  Metformin 500 mg BID Glipizide 5 mg daily  Farxiga 10 mg, 1 tablet with Breakfast  Tresiba 20 units daily       Statin: Yes  ACE-I/ARB: Yes    METER DOWNLOAD SUMMARY: unable to download   67 -267 mg/dL    DIABETIC COMPLICATIONS: Microvascular complications:    Denies: retinopathy, neuropathy , CKD  Last eye exam: Completed 11/25/2022   Macrovascular complications:    Denies: CAD, PVD, CVA  HISTORY:  Past Medical History:  Past Medical History:  Diagnosis Date   Alcohol abuse    NONE SINCE 1997    Alcohol-induced pancreatitis    NO ETOH SINCE 1997   Diabetes mellitus without complication (HCC)    Hyperlipidemia    Hypertension    Thyroid disease    Past Surgical History:  Past Surgical History:  Procedure Laterality Date   ABDOMINAL HYSTERECTOMY     DUE TO DUB   CHOLECYSTECTOMY     BILIARY COLIC-NO STONES   Social History:  reports that she has never smoked. She has never used smokeless tobacco. No history on file for alcohol use and drug use. Family History:  Family History  Problem Relation Age of Onset   Dementia Father    Depression Sister      HOME MEDICATIONS: Allergies as of 11/17/2023       Reactions   Codeine Nausea And Vomiting   Tramadol Nausea And Vomiting        Medication List        Accurate as of November 17, 2023  2:01 PM. If you have any questions, ask your nurse or doctor.          albuterol 108 (90 Base) MCG/ACT inhaler Commonly known as: VENTOLIN HFA SMARTSIG:2 Puff(s) By Mouth Every 4-6 Hours PRN   Belsomra 10 MG Tabs Generic drug: Suvorexant Take 1 tablet by mouth at bedtime. What changed: Another medication with the same name was removed. Continue taking this medication, and follow the directions you see here. Changed by: Johnney Ou Essie Gehret  Breo Ellipta 100-25 MCG/ACT Aepb Generic drug: fluticasone furoate-vilanterol 1 puff daily.   esomeprazole 40 MG capsule Commonly known as: NEXIUM Take 40 mg by mouth daily.   Farxiga 10 MG Tabs tablet Generic drug: dapagliflozin propanediol TAKE 1 TABLET(10 MG) BY MOUTH DAILY BEFORE BREAKFAST   FLUoxetine 40 MG capsule Commonly known as: PROZAC Take 40 mg by mouth daily.   glipiZIDE 5 MG tablet Commonly known as: GLUCOTROL Take 1 tablet (5 mg total) by mouth daily before breakfast.   Insulin Pen Needle 32G X 4 MM Misc 1 Device by Does not apply route daily in the afternoon.   levothyroxine 88 MCG tablet Commonly known as: SYNTHROID Take 88 mcg by mouth daily before  breakfast.   losartan-hydrochlorothiazide 50-12.5 MG tablet Commonly known as: HYZAAR Take 1 tablet by mouth daily.   metFORMIN 500 MG 24 hr tablet Commonly known as: GLUCOPHAGE-XR Take 1 tablet (500 mg total) by mouth 2 (two) times daily with a meal.   multivitamin with minerals Tabs tablet Take 1 tablet by mouth daily.   OneTouch Delica Lancets 33G Misc USE TO TEST BLOOD GLUCOSE ONCE A DAY   OneTouch Ultra test strip Generic drug: glucose blood USE TO CHECK BLOOD SUGAR ONCE A DAY   potassium chloride 10 MEQ tablet Commonly known as: KLOR-CON M Take 10 mEq by mouth daily.   potassium chloride 10 MEQ tablet Commonly known as: KLOR-CON Take 10 mEq by mouth daily.   simvastatin 20 MG tablet Commonly known as: ZOCOR Take 20 mg by mouth daily.   Evaristo Bury FlexTouch 100 UNIT/ML FlexTouch Pen Generic drug: insulin degludec ADMINISTER 20 UNITS UNDER THE SKIN DAILY   VITAMIN D3 PO Take by mouth. 3 times a week         OBJECTIVE:   Vital Signs: BP 126/80 (BP Location: Left Arm, Patient Position: Sitting, Cuff Size: Normal)   Pulse 100   Ht 5\' 4"  (1.626 m)   Wt 172 lb (78 kg)   SpO2 97%   BMI 29.52 kg/m   Wt Readings from Last 3 Encounters:  11/17/23 172 lb (78 kg)  03/02/23 181 lb (82.1 kg)  09/01/22 171 lb (77.6 kg)     Exam: General: Pt appears well and is in NAD  Lungs: Clear with good BS bilat   Heart: RRR   Extremities: No pretibial edema.   Neuro: MS is good with appropriate affect, pt is alert and Ox3    DM foot exam: 11/17/2023   The skin of the feet is intact without sores or ulcerations. The pedal pulses are 2+ on right and 2+ on left. The sensation is intact to a screening 5.07, 10 gram monofilament bilaterally      DATA REVIEWED:  Lab Results  Component Value Date   HGBA1C 7.7 (A) 11/17/2023   HGBA1C 7.5 (A) 03/02/2023   HGBA1C 9.1 (A) 09/01/2022    Latest Reference Range & Units 03/02/23 09:55  Creatinine,U mg/dL 44.0  Microalb,  Ur 0.0 - 1.9 mg/dL <1.0  MICROALB/CREAT RATIO 0.0 - 30.0 mg/g 1.7    04/15/2022 BUN 17 Cr 0.92 GFR 70 HDL 46 LDL 38   ASSESSMENT / PLAN / RECOMMENDATIONS:   1) Type 2 Diabetes Mellitus, Sub- Optimally  Controlled , Without microalbuminuria  complications - Most recent A1c of 7.7 %. Goal A1c < 7.0 %.    - A1c continues to be above goal - She wanted to  discontinue Pioglitazone due to weight gain  - Dexcom not covered, she  declined Freestyle libre in the past - She has a history of pancreatitis, hence GLP-1 agonist and DPP 4 inhibitors should be avoided  -I will increase her glipizide as below and decrease insulin to prevent hypoglycemia -She is skeptical about metformin side effects, we discussed glycemic and cardiovascular benefits, and since she is not having any side effects I have encouraged her to continue   MEDICATIONS: - Continue Metformin 500 mg, 1 tablet before Breakfast and 1 tablet before Dinner -Increase glipizide 5 mg, BID - Continue Farxiga 10 mg, 1 tablet with Breakfast  -Decrease Tresiba 16 units daily   EDUCATION / INSTRUCTIONS: BG monitoring instructions: Patient is instructed to check her blood sugars 2 times a day,fasting and supper   Call [place] [facility] if: BG persistently < 70  I reviewed the Rule of 15 for the treatment of hypoglycemia in detail with the patient. Literature supplied.    2) Diabetic complications:  Eye: Does not have known diabetic retinopathy.  Neuro/ Feet: Does not have known diabetic peripheral neuropathy .  Renal: Patient does not have known baseline CKD. She   is  on an ACEI/ARB at present. Check    3) Microalbuminuria:  -Resolved as of labs 02/2023  F/U in 4 months    Signed electronically by: Lyndle Herrlich, MD  [facility] Endocrinology  [facility] [address] [place], [place] [address] Phone: [phone] FAX: [phone]   CC: Sheela Stack [address] [place] [address] Phone: [phone]  Fax: [phone]  Return to Endocrinology clinic as below: No future appointments.

## 2023-12-22 DIAGNOSIS — E1165 Type 2 diabetes mellitus with hyperglycemia: Secondary | ICD-10-CM | POA: Diagnosis not present

## 2023-12-22 DIAGNOSIS — E559 Vitamin D deficiency, unspecified: Secondary | ICD-10-CM | POA: Diagnosis not present

## 2023-12-22 DIAGNOSIS — I10 Essential (primary) hypertension: Secondary | ICD-10-CM | POA: Diagnosis not present

## 2023-12-22 DIAGNOSIS — E039 Hypothyroidism, unspecified: Secondary | ICD-10-CM | POA: Diagnosis not present

## 2023-12-22 DIAGNOSIS — E785 Hyperlipidemia, unspecified: Secondary | ICD-10-CM | POA: Diagnosis not present

## 2023-12-27 DIAGNOSIS — E1165 Type 2 diabetes mellitus with hyperglycemia: Secondary | ICD-10-CM | POA: Diagnosis not present

## 2023-12-27 DIAGNOSIS — E039 Hypothyroidism, unspecified: Secondary | ICD-10-CM | POA: Diagnosis not present

## 2023-12-27 DIAGNOSIS — E785 Hyperlipidemia, unspecified: Secondary | ICD-10-CM | POA: Diagnosis not present

## 2023-12-27 DIAGNOSIS — I10 Essential (primary) hypertension: Secondary | ICD-10-CM | POA: Diagnosis not present

## 2024-03-19 ENCOUNTER — Ambulatory Visit: Payer: 59 | Admitting: Internal Medicine

## 2024-03-26 ENCOUNTER — Other Ambulatory Visit: Payer: Self-pay | Admitting: Internal Medicine

## 2024-04-01 ENCOUNTER — Encounter: Payer: Self-pay | Admitting: Internal Medicine

## 2024-04-01 ENCOUNTER — Ambulatory Visit (INDEPENDENT_AMBULATORY_CARE_PROVIDER_SITE_OTHER): Admitting: Internal Medicine

## 2024-04-01 VITALS — BP 124/80 | HR 97 | Ht 64.0 in | Wt 165.0 lb

## 2024-04-01 DIAGNOSIS — Z7984 Long term (current) use of oral hypoglycemic drugs: Secondary | ICD-10-CM

## 2024-04-01 DIAGNOSIS — E1165 Type 2 diabetes mellitus with hyperglycemia: Secondary | ICD-10-CM | POA: Diagnosis not present

## 2024-04-01 DIAGNOSIS — Z7985 Long-term (current) use of injectable non-insulin antidiabetic drugs: Secondary | ICD-10-CM | POA: Diagnosis not present

## 2024-04-01 LAB — POCT GLYCOSYLATED HEMOGLOBIN (HGB A1C): Hemoglobin A1C: 9.9 % — AB (ref 4.0–5.6)

## 2024-04-01 MED ORDER — GLIPIZIDE 10 MG PO TABS: 10.0000 mg | ORAL_TABLET | Freq: Two times a day (BID) | ORAL | 3 refills | Status: DC

## 2024-04-01 MED ORDER — METFORMIN HCL ER 500 MG PO TB24: 500.0000 mg | ORAL_TABLET | Freq: Two times a day (BID) | ORAL | 3 refills | Status: AC

## 2024-04-01 MED ORDER — INSULIN PEN NEEDLE 32G X 4 MM MISC: 1.0000 | Freq: Every day | 3 refills | Status: AC

## 2024-04-01 MED ORDER — DAPAGLIFLOZIN PROPANEDIOL 10 MG PO TABS: 10.0000 mg | ORAL_TABLET | Freq: Every day | ORAL | 3 refills | Status: DC

## 2024-04-01 MED ORDER — TRESIBA FLEXTOUCH 100 UNIT/ML ~~LOC~~ SOPN: 20.0000 [IU] | PEN_INJECTOR | Freq: Every day | SUBCUTANEOUS | 4 refills | Status: AC

## 2024-04-01 NOTE — Patient Instructions (Addendum)
-   Increase Glipizide  10 mg, 1 tablet before Breakfast and 1 tablet before Supper  - Continue Metformin  500 mg, 1 tablet before Breakfast and 1 tablet before Dinner - Continue  Farxiga  10 mg, 1 tablet with Breakfast  - Increase  Tresiba   20  units daily     HOW TO TREAT LOW BLOOD SUGARS (Blood sugar LESS THAN 70 MG/DL) Please follow the RULE OF 15 for the treatment of hypoglycemia treatment (when your (blood sugars are less than 70 mg/dL)   STEP 1: Take 15 grams of carbohydrates when your blood sugar is low, which includes:  3-4 GLUCOSE TABS  OR 3-4 OZ OF JUICE OR REGULAR SODA OR ONE TUBE OF GLUCOSE GEL    STEP 2: RECHECK blood sugar in 15 MINUTES STEP 3: If your blood sugar is still low at the 15 minute recheck --> then, go back to STEP 1 and treat AGAIN with another 15 grams of carbohydrates

## 2024-04-01 NOTE — Progress Notes (Signed)
 Name: Kayla [name]  Age/ Sex: 63 y.o., female   MRN/ DOB: [id number], May 27, 1961     PCP: Skillman, Katherine E, PA-C   Reason for Endocrinology Evaluation: Type 2 Diabetes Mellitus  Initial Endocrine Consultative Visit: 12/11/2019    PATIENT IDENTIFIER: Kayla [name] is a 63 y.o. female with a past medical history of HTN, Dyslipidemia and T2DM. The patient has followed with Endocrinology clinic since 12/11/2019 for consultative assistance with management of her diabetes.  DIABETIC HISTORY:  Kayla [name] was diagnosed with T2 DM  Many years ago. She has been on metformin  since her diagnosis.  Her hemoglobin A1c  peaking at 11.3 % in in 2021   On her initial visit to our clinic she had an A1c 11.3%, we reduced Metformin  due to diarrhea , we started SU as she declined insulin .    Basal insulin  and farxiga  started 10/2020 Started pioglitazone  11/2021, but she would like to discontinue this by 02/2023 due to weight gain, she was started on glipizide    Microalbuminuria resolved 02/2023  SUBJECTIVE:   During the last visit (11/17/2023): A1c 7.7% .     Today (04/01/2024): Kayla [name] is here for a follow up on diabetes management.   She checks her blood sugars occasionally.The patient has had hypoglycemic episodes since the last clinic visit,  Avoids sugar sweetened beverages  Denies nausea or vomiting  Denies constipation or diarrhea She snacks on cracker and chips     HOME DIABETES REGIMEN:  Metformin  500 mg BID Glipizide  5 mg BID  Farxiga  10 mg, 1 tablet with Breakfast  Tresiba  16 units daily    Statin: Yes  ACE-I/ARB: Yes    METER DOWNLOAD SUMMARY: unable to download   166 - 355 mg/dL    DIABETIC COMPLICATIONS: Microvascular complications:    Denies: retinopathy, neuropathy , CKD  Last eye exam: Completed 11/25/2022   Macrovascular complications:    Denies: CAD, PVD, CVA  HISTORY:  Past Medical History:  Past Medical History:  Diagnosis Date    Alcohol  abuse    NONE SINCE 1997   Alcohol -induced pancreatitis    NO ETOH SINCE 1997   Diabetes mellitus without complication (HCC)    Hyperlipidemia    Hypertension    Thyroid  disease    Past Surgical History:  Past Surgical History:  Procedure Laterality Date   ABDOMINAL HYSTERECTOMY     DUE TO DUB   CHOLECYSTECTOMY     BILIARY COLIC-NO STONES   Social History:  reports that she has never smoked. She has never used smokeless tobacco. No history on file for alcohol  use and drug use. Family History:  Family History  Problem Relation Age of Onset   Dementia Father    Depression Sister      HOME MEDICATIONS: Allergies as of 04/01/2024       Reactions   Codeine Nausea And Vomiting   Tramadol Nausea And Vomiting        Medication List        Accurate as of April 01, 2024 10:20 AM. If you have any questions, ask your nurse or doctor.          albuterol 108 (90 Base) MCG/ACT inhaler Commonly known as: VENTOLIN HFA SMARTSIG:2 Puff(s) By Mouth Every 4-6 Hours PRN   Belsomra 10 MG Tabs Generic drug: Suvorexant Take 1 tablet by mouth at bedtime.   Breo Ellipta 100-25 MCG/ACT Aepb Generic drug: fluticasone furoate-vilanterol 1 puff daily.   esomeprazole 40 MG capsule Commonly known  as: NEXIUM Take 40 mg by mouth daily.   Farxiga  10 MG Tabs tablet Generic drug: dapagliflozin  propanediol TAKE 1 TABLET(10 MG) BY MOUTH DAILY BEFORE BREAKFAST   FLUoxetine 40 MG capsule Commonly known as: PROZAC Take 40 mg by mouth daily.   glipiZIDE  5 MG tablet Commonly known as: GLUCOTROL  Take 1 tablet (5 mg total) by mouth 2 (two) times daily before a meal.   Insulin  Pen Needle 32G X 4 MM Misc 1 Device by Does not apply route daily in the afternoon.   levothyroxine 88 MCG tablet Commonly known as: SYNTHROID Take 88 mcg by mouth daily before breakfast.   losartan-hydrochlorothiazide 50-12.5 MG tablet Commonly known as: HYZAAR Take 1 tablet by mouth daily.    metFORMIN  500 MG 24 hr tablet Commonly known as: GLUCOPHAGE -XR Take 1 tablet (500 mg total) by mouth 2 (two) times daily with a meal.   multivitamin with minerals Tabs tablet Take 1 tablet by mouth daily.   OneTouch Delica Lancets 33G Misc USE TO TEST BLOOD GLUCOSE ONCE A DAY   OneTouch Ultra test strip Generic drug: glucose blood USE TO CHECK BLOOD SUGAR ONCE A DAY   potassium chloride 10 MEQ tablet Commonly known as: KLOR-CON M Take 10 mEq by mouth daily.   potassium chloride 10 MEQ tablet Commonly known as: KLOR-CON Take 10 mEq by mouth daily.   simvastatin 20 MG tablet Commonly known as: ZOCOR Take 20 mg by mouth daily.   Tresiba  FlexTouch 100 UNIT/ML FlexTouch Pen Generic drug: insulin  degludec Inject 16 Units into the skin daily.   VITAMIN D3 PO Take by mouth. 3 times a week   zolpidem 10 MG tablet Commonly known as: AMBIEN Take 10 mg by mouth at bedtime.         OBJECTIVE:   Vital Signs: BP 124/80 (BP Location: Left Arm, Patient Position: Sitting, Cuff Size: Normal)   Pulse 97   Ht 5' 4 (1.626 m)   Wt 165 lb (74.8 kg)   SpO2 99%   BMI 28.32 kg/m   Wt Readings from Last 3 Encounters:  04/01/24 165 lb (74.8 kg)  11/17/23 172 lb (78 kg)  03/02/23 181 lb (82.1 kg)     Exam: General: Pt appears well and is in NAD  Lungs: Clear with good BS bilat   Heart: RRR   Extremities: No pretibial edema.   Neuro: MS is good with appropriate affect, pt is alert and Ox3    DM foot exam: 11/17/2023   The skin of the feet is intact without sores or ulcerations. The pedal pulses are 2+ on right and 2+ on left. The sensation is intact to a screening 5.07, 10 gram monofilament bilaterally      DATA REVIEWED:  Lab Results  Component Value Date   HGBA1C 7.7 (A) 11/17/2023   HGBA1C 7.5 (A) 03/02/2023   HGBA1C 9.1 (A) 09/01/2022    Latest Reference Range & Units 03/02/23 09:55  Creatinine,U mg/dL 58.2  Microalb, Ur 0.0 - 1.9 mg/dL <9.2   MICROALB/CREAT RATIO 0.0 - 30.0 mg/g 1.7       ASSESSMENT / PLAN / RECOMMENDATIONS:   1) Type 2 Diabetes Mellitus, Poorly Optimally  Controlled , Without microalbuminuria  complications - Most recent A1c of 9.9 %. Goal A1c < 7.0 %.    - Patient with worsening glycemic control, this is due to dietary indiscretions -I did offer to refer her to our CDE, but the patient declines, she would like to work on this by  herself.  We discussed the importance of avoiding snacks, we discussed low-carb options for snacks, she was also advised to avoid sugar sweetened beverages - She wanted to  discontinue Pioglitazone  due to weight gain  - Dexcom not covered, she declined Freestyle libre in the past - She has a history of pancreatitis, hence GLP-1 agonist and DPP 4 inhibitors should be avoided  -I will increase her glipizide  a as below as well as her insulin  -She is skeptical about metformin  side effects - I did explain to the patient that if her hypoglycemia does not improve we have no other option but to start her on prandial insulin , we have opted to hold off on this for now and see what she can do with the following changes  MEDICATIONS: - Continue Metformin  500 mg, 1 tablet before Breakfast and 1 tablet before Dinner -Increase glipizide  10 mg, BID -Continue Farxiga  10 mg, 1 tablet with Breakfast  - Increase Tresiba  20 units daily   EDUCATION / INSTRUCTIONS: BG monitoring instructions: Patient is instructed to check her blood sugars 2 times a day,fasting and supper   Call  Endocrinology clinic if: BG persistently < 70  I reviewed the Rule of 15 for the treatment of hypoglycemia in detail with the patient. Literature supplied.    2) Diabetic complications:  Eye: Does not have known diabetic retinopathy.  Neuro/ Feet: Does not have known diabetic peripheral neuropathy .  Renal: Patient does not have known baseline CKD. She   is  on an ACEI/ARB at present. Check    3)  Microalbuminuria:  -Resolved as of labs 02/2023  F/U in 3 months    Signed electronically by: Stefano Redgie Butts, MD  [facility] Endocrinology  [facility] [address] [place] [place], [place] [address] Phone: [phone] FAX: [phone]   CC: Skillman, Katherine E, PA-C [address] [place] [address] Phone: [phone]  Fax: [phone]  Return to Endocrinology clinic as below: No future appointments.

## 2024-05-29 DIAGNOSIS — I10 Essential (primary) hypertension: Secondary | ICD-10-CM | POA: Diagnosis not present

## 2024-05-29 DIAGNOSIS — E1165 Type 2 diabetes mellitus with hyperglycemia: Secondary | ICD-10-CM | POA: Diagnosis not present

## 2024-05-29 DIAGNOSIS — E785 Hyperlipidemia, unspecified: Secondary | ICD-10-CM | POA: Diagnosis not present

## 2024-05-29 DIAGNOSIS — E039 Hypothyroidism, unspecified: Secondary | ICD-10-CM | POA: Diagnosis not present

## 2024-06-11 ENCOUNTER — Other Ambulatory Visit (HOSPITAL_COMMUNITY): Payer: Self-pay | Admitting: Physician Assistant

## 2024-06-11 DIAGNOSIS — Z1231 Encounter for screening mammogram for malignant neoplasm of breast: Secondary | ICD-10-CM

## 2024-06-11 DIAGNOSIS — Z1212 Encounter for screening for malignant neoplasm of rectum: Secondary | ICD-10-CM | POA: Diagnosis not present

## 2024-07-04 ENCOUNTER — Encounter: Payer: Self-pay | Admitting: Internal Medicine

## 2024-07-04 ENCOUNTER — Ambulatory Visit: Admitting: Internal Medicine

## 2024-07-04 VITALS — BP 120/74 | HR 100 | Ht 64.0 in | Wt 167.4 lb

## 2024-07-04 DIAGNOSIS — Z794 Long term (current) use of insulin: Secondary | ICD-10-CM

## 2024-07-04 DIAGNOSIS — Z7984 Long term (current) use of oral hypoglycemic drugs: Secondary | ICD-10-CM

## 2024-07-04 DIAGNOSIS — E1165 Type 2 diabetes mellitus with hyperglycemia: Secondary | ICD-10-CM | POA: Diagnosis not present

## 2024-07-04 LAB — POCT GLYCOSYLATED HEMOGLOBIN (HGB A1C): Hemoglobin A1C: 9.2 % — AB (ref 4.0–5.6)

## 2024-07-04 MED ORDER — FREESTYLE LIBRE 3 PLUS SENSOR MISC: 1.0000 | 3 refills | Status: AC

## 2024-07-04 MED ORDER — GLIPIZIDE 10 MG PO TABS: 20.0000 mg | ORAL_TABLET | Freq: Two times a day (BID) | ORAL | 3 refills | Status: DC

## 2024-07-04 NOTE — Patient Instructions (Signed)
-   Increase Glipizide  10 mg, 2 tablet before Breakfast and 2 tablet before Supper  - Continue Metformin  500 mg, 1 tablet before Breakfast and 1 tablet before Dinner - Continue  Farxiga  10 mg, 1 tablet with Breakfast  - Continue Tresiba   20  units daily     HOW TO TREAT LOW BLOOD SUGARS (Blood sugar LESS THAN 70 MG/DL) Please follow the RULE OF 15 for the treatment of hypoglycemia treatment (when your (blood sugars are less than 70 mg/dL)   STEP 1: Take 15 grams of carbohydrates when your blood sugar is low, which includes:  3-4 GLUCOSE TABS  OR 3-4 OZ OF JUICE OR REGULAR SODA OR ONE TUBE OF GLUCOSE GEL    STEP 2: RECHECK blood sugar in 15 MINUTES STEP 3: If your blood sugar is still low at the 15 minute recheck --> then, go back to STEP 1 and treat AGAIN with another 15 grams of carbohydrates

## 2024-07-04 NOTE — Progress Notes (Signed)
 Name: Kayla [name]  Age/ Sex: 63 y.o., female   MRN/ DOB: [id number], Feb 13, 1961     PCP: Skillman, Katherine E, PA-C   Reason for Endocrinology Evaluation: Type 2 Diabetes Mellitus  Initial Endocrine Consultative Visit: 12/11/2019    PATIENT IDENTIFIER: [name] is a 63 y.o. female with a past medical history of HTN, Dyslipidemia and T2DM. The patient has followed with Endocrinology clinic since 12/11/2019 for consultative assistance with management of her diabetes.  DIABETIC HISTORY:  [name] was diagnosed with T2 DM  Many years ago. She has been on metformin  since her diagnosis.  Her hemoglobin A1c  peaking at 11.3 % in in 2021   On her initial visit to our clinic she had an A1c 11.3%, we reduced Metformin  due to diarrhea , we started SU as she declined insulin .    Basal insulin  and farxiga  started 10/2020 Started pioglitazone  11/2021, but she would like to discontinue this by 02/2023 due to weight gain, she was started on glipizide    Microalbuminuria resolved 02/2023  SUBJECTIVE:   During the last visit (04/01/2024): A1c 9.9% .     Today (07/04/2024): [name] is here for a follow up on diabetes management.   She checks her blood sugars occasionally.The patient has had hypoglycemic episodes since the last clinic visit,  No nausea or vomiting  No constipation or diarrhea    HOME DIABETES REGIMEN:  Metformin  500 mg BID Glipizide  10 mg BID  Farxiga  10 mg, 1 tablet with Breakfast  Tresiba  20 units daily    Statin: Yes  ACE-I/ARB: Yes    METER DOWNLOAD SUMMARY: unable to download   130- 243 mg/dL    DIABETIC COMPLICATIONS: Microvascular complications:    Denies: retinopathy, neuropathy , CKD  Last eye exam: Completed 11/25/2022   Macrovascular complications:    Denies: CAD, PVD, CVA  HISTORY:  Past Medical History:  Past Medical History:  Diagnosis Date   Alcohol  abuse    NONE SINCE 1997   Alcohol -induced pancreatitis    NO ETOH SINCE  1997   Diabetes mellitus without complication (HCC)    Hyperlipidemia    Hypertension    Thyroid  disease    Past Surgical History:  Past Surgical History:  Procedure Laterality Date   ABDOMINAL HYSTERECTOMY     DUE TO DUB   CHOLECYSTECTOMY     BILIARY COLIC-NO STONES   Social History:  reports that she has never smoked. She has never used smokeless tobacco. No history on file for alcohol  use and drug use. Family History:  Family History  Problem Relation Age of Onset   Dementia Father    Depression Sister      HOME MEDICATIONS: Allergies as of 07/04/2024       Reactions   Codeine Nausea And Vomiting   Tramadol Nausea And Vomiting        Medication List        Accurate as of July 04, 2024 10:00 AM. If you have any questions, ask your nurse or doctor.          albuterol 108 (90 Base) MCG/ACT inhaler Commonly known as: VENTOLIN HFA SMARTSIG:2 Puff(s) By Mouth Every 4-6 Hours PRN   Belsomra 10 MG Tabs Generic drug: Suvorexant Take 1 tablet by mouth at bedtime.   Breo Ellipta 100-25 MCG/ACT Aepb Generic drug: fluticasone furoate-vilanterol 1 puff daily.   dapagliflozin  propanediol 10 MG Tabs tablet Commonly known as: Farxiga  Take 1 tablet (10 mg total) by mouth daily.  esomeprazole 40 MG capsule Commonly known as: NEXIUM Take 40 mg by mouth daily.   FLUoxetine 40 MG capsule Commonly known as: PROZAC Take 40 mg by mouth daily.   glipiZIDE  10 MG tablet Commonly known as: GLUCOTROL  Take 1 tablet (10 mg total) by mouth 2 (two) times daily before a meal.   Insulin  Pen Needle 32G X 4 MM Misc 1 Device by Does not apply route daily in the afternoon.   levothyroxine 88 MCG tablet Commonly known as: SYNTHROID Take 88 mcg by mouth daily before breakfast.   losartan-hydrochlorothiazide 50-12.5 MG tablet Commonly known as: HYZAAR Take 1 tablet by mouth daily.   metFORMIN  500 MG 24 hr tablet Commonly known as: GLUCOPHAGE -XR Take 1 tablet (500 mg  total) by mouth 2 (two) times daily with a meal.   multivitamin with minerals Tabs tablet Take 1 tablet by mouth daily.   OneTouch Delica Lancets 33G Misc USE TO TEST BLOOD GLUCOSE ONCE A DAY   OneTouch Ultra test strip Generic drug: glucose blood USE TO CHECK BLOOD SUGAR ONCE A DAY   potassium chloride 10 MEQ tablet Commonly known as: KLOR-CON M Take 10 mEq by mouth daily.   potassium chloride 10 MEQ tablet Commonly known as: KLOR-CON Take 10 mEq by mouth daily.   simvastatin 20 MG tablet Commonly known as: ZOCOR Take 20 mg by mouth daily.   Tresiba  FlexTouch 100 UNIT/ML FlexTouch Pen Generic drug: insulin  degludec Inject 20 Units into the skin daily.   VITAMIN D3 PO Take by mouth. 3 times a week   zolpidem 10 MG tablet Commonly known as: AMBIEN Take 10 mg by mouth at bedtime.         OBJECTIVE:   Vital Signs: BP 120/74 (BP Location: Left Arm, Patient Position: Sitting, Cuff Size: Large)   Pulse 100   Ht 5' 4 (1.626 m)   Wt 167 lb 6.4 oz (75.9 kg)   SpO2 96%   BMI 28.73 kg/m   Wt Readings from Last 3 Encounters:  07/04/24 167 lb 6.4 oz (75.9 kg)  04/01/24 165 lb (74.8 kg)  11/17/23 172 lb (78 kg)     Exam: General: Pt appears well and is in NAD  Lungs: Clear with good BS bilat   Heart: RRR   Extremities: No pretibial edema.   Neuro: MS is good with appropriate affect, pt is alert and Ox3    DM foot exam: 07/04/2024   The skin of the feet is intact without sores or ulcerations. The pedal pulses are 2+ on right and 2+ on left. The sensation is intact to a screening 5.07, 10 gram monofilament bilaterally      DATA REVIEWED:  Lab Results  Component Value Date   HGBA1C 9.9 (A) 04/01/2024   HGBA1C 7.7 (A) 11/17/2023   HGBA1C 7.5 (A) 03/02/2023    Latest Reference Range & Units 03/02/23 09:55  Creatinine,U mg/dL 58.2  Microalb, Ur 0.0 - 1.9 mg/dL <9.2  MICROALB/CREAT RATIO 0.0 - 30.0 mg/g 1.7       ASSESSMENT / PLAN /  RECOMMENDATIONS:   1) Type 2 Diabetes Mellitus, Poorly Optimally  Controlled , Without microalbuminuria  complications - Most recent A1c of 9.2 %. Goal A1c < 7.0 %.     -Patient continues with hyperglycemia -Slight decrease in A1c from 9.9% to 9.2% -Emphasized the importance of frequent glucose checks, Dexcom was cost prohibitive, historically she had declined freestyle [place], but she agreed to it today, a prescription will be faxed to her  DME supplier -She did decline a referral to our CDE in the past - She wanted to  discontinue Pioglitazone  due to weight gain  - She has a history of pancreatitis, hence GLP-1 agonist and DPP 4 inhibitors should be avoided  -I will increase glipizide  as she declines multiple daily injections of insulin , I did explain to the patient that this will be the maximum dose of glipizide , other glycemic agents are limited   MEDICATIONS: - Continue Metformin  500 mg, 1 tablet before Breakfast and 1 tablet before Dinner -Increase glipizide  10 mg, 2 tabs BID -Continue Farxiga  10 mg, 1 tablet with Breakfast  - Continue Tresiba  20 units daily   EDUCATION / INSTRUCTIONS: BG monitoring instructions: Patient is instructed to check her blood sugars 2 times a day,fasting and supper   Call [facility] if: BG persistently < 70  I reviewed the Rule of 15 for the treatment of hypoglycemia in detail with the patient. Literature supplied.    2) Diabetic complications:  Eye: Does not have known diabetic retinopathy.  Neuro/ Feet: Does not have known diabetic peripheral neuropathy .  Renal: Patient does not have known baseline CKD. She   is  on an ACEI/ARB at present. Check    3) Microalbuminuria:  -Resolved as of labs 02/2023  F/U in 4 months    Signed electronically by: Stefano Redgie Butts, MD  [facility]  [facility] [address] [place] [place], [place] [address] Phone: [phone] FAX:  [phone]   CC: Skillman, Katherine E, PA-C [address] [place] [address] Phone: [phone]  Fax: [phone]  Return to Endocrinology clinic as below: Future Appointments  Date Time Provider Department Center  07/04/2024 10:30 AM Toiya Morrish, Donell Redgie, MD [id number] None  07/08/2024 12:30 PM [facility] 1 [facility]

## 2024-07-05 ENCOUNTER — Ambulatory Visit (HOSPITAL_COMMUNITY)

## 2024-07-08 ENCOUNTER — Ambulatory Visit (HOSPITAL_COMMUNITY)
Admission: RE | Admit: 2024-07-08 | Discharge: 2024-07-08 | Disposition: A | Discharge status: Home / Self Care | Source: Ambulatory Visit | Attending: Physician Assistant | Admitting: Physician Assistant

## 2024-07-08 DIAGNOSIS — Z1231 Encounter for screening mammogram for malignant neoplasm of breast: Secondary | ICD-10-CM | POA: Diagnosis not present

## 2024-08-07 NOTE — Progress Notes (Signed)
 Kayla [name]                                          MRN: [id number]   08/07/2024   The [facility] Quality Team Specialist reviewed this patient medical record for the purposes of chart review for care gap closure. The following were reviewed: chart review for care gap closure-kidney health evaluation for diabetes:eGFR  and uACR.    [facility] Quality Team

## 2024-09-02 ENCOUNTER — Other Ambulatory Visit: Payer: Self-pay

## 2024-09-02 MED ORDER — GLIPIZIDE 10 MG PO TABS: 20.0000 mg | ORAL_TABLET | Freq: Two times a day (BID) | ORAL | 3 refills | Status: AC

## 2024-09-27 ENCOUNTER — Other Ambulatory Visit: Payer: Self-pay | Admitting: Internal Medicine

## 2024-10-22 NOTE — Progress Notes (Signed)
 Kayla [name]                                          MRN: [id number]   10/22/2024   The [facility] Quality Team Specialist reviewed this patient medical record for the purposes of chart review for care gap closure. The following were reviewed: chart review for care gap closure-glycemic status assessment.    [facility] Quality Team

## 2024-10-30 ENCOUNTER — Ambulatory Visit: Admitting: Internal Medicine

## 2024-10-30 NOTE — Progress Notes (Unsigned)
 "   Name: Kayla [name]  Age/ Sex: 64 y.o., female   MRN/ DOB: [id number], 07-02-1961     PCP: [name], Kayla E, PA-C   Reason for Endocrinology Evaluation: Type 2 Diabetes Mellitus  Initial Endocrine Consultative Visit: 12/11/2019    PATIENT IDENTIFIER: [name] is a 64 y.o. female with a past medical history of HTN, Dyslipidemia and T2DM. The patient has followed with Endocrinology clinic since 12/11/2019 for consultative assistance with management of her diabetes.  DIABETIC HISTORY:  Kayla [name] was diagnosed with T2 DM  Many years ago. She has been on metformin  since her diagnosis.  Her hemoglobin A1c  peaking at 11.3 % in in 2021   On her initial visit to our clinic she had an A1c 11.3%, we reduced Metformin  due to diarrhea , we started SU as she declined insulin .    Basal insulin  and farxiga  started 10/2020 Started pioglitazone  11/2021, but she would like to discontinue this by 02/2023 due to weight gain, she was started on glipizide    Microalbuminuria resolved 02/2023  SUBJECTIVE:   During the last visit (07/04/2024): A1c 9.2% .     Today (10/30/2024): [name] is here for a follow up on diabetes management.   She checks her blood sugars occasionally.The patient has had hypoglycemic episodes since the last clinic visit,  No nausea or vomiting  No constipation or diarrhea    HOME DIABETES REGIMEN:  Metformin  500 mg BID Glipizide  10 mg,2 tabs  BID  Farxiga  10 mg, 1 tablet with Breakfast  Tresiba  20 units daily    Statin: Yes  ACE-I/ARB: Yes    METER DOWNLOAD SUMMARY: unable to download   130- 243 mg/dL    DIABETIC COMPLICATIONS: Microvascular complications:    Denies: retinopathy, neuropathy , CKD  Last eye exam: Completed 11/25/2022   Macrovascular complications:    Denies: CAD, PVD, CVA  HISTORY:  Past Medical History:  Past Medical History:  Diagnosis Date   Alcohol  abuse    NONE SINCE 1997   Alcohol -induced pancreatitis    NO  ETOH SINCE 1997   Diabetes mellitus without complication (HCC)    Hyperlipidemia    Hypertension    Thyroid  disease    Past Surgical History:  Past Surgical History:  Procedure Laterality Date   ABDOMINAL HYSTERECTOMY     DUE TO DUB   CHOLECYSTECTOMY     BILIARY COLIC-NO STONES   Social History:  reports that she has never smoked. She has never used smokeless tobacco. No history on file for alcohol  use and drug use. Family History:  Family History  Problem Relation Age of Onset   Dementia Father    Depression Sister      HOME MEDICATIONS: Allergies as of 10/30/2024       Reactions   Codeine Nausea And Vomiting   Tramadol Nausea And Vomiting        Medication List        Accurate as of October 30, 2024  7:21 AM. If you have any questions, ask your nurse or doctor.          albuterol 108 (90 Base) MCG/ACT inhaler Commonly known as: VENTOLIN HFA SMARTSIG:2 Puff(s) By Mouth Every 4-6 Hours PRN   Belsomra 10 MG Tabs Generic drug: Suvorexant Take 1 tablet by mouth at bedtime.   Breo Ellipta 100-25 MCG/ACT Aepb Generic drug: fluticasone furoate-vilanterol 1 puff daily.   esomeprazole 40 MG capsule Commonly known as: NEXIUM Take 40 mg by mouth daily.  Farxiga  10 MG Tabs tablet Generic drug: dapagliflozin  propanediol TAKE 1 TABLET(10 MG) BY MOUTH DAILY BEFORE BREAKFAST   FLUoxetine 40 MG capsule Commonly known as: PROZAC Take 40 mg by mouth daily.   FreeStyle Libre 3 Plus Sensor Misc 1 Device by Other route every 14 (fourteen) days. Change sensor every 15 days.   glipiZIDE  10 MG tablet Commonly known as: GLUCOTROL  Take 2 tablets (20 mg total) by mouth 2 (two) times daily before a meal.   Insulin  Pen Needle 32G X 4 MM Misc 1 Device by Does not apply route daily in the afternoon.   levothyroxine 88 MCG tablet Commonly known as: SYNTHROID Take 88 mcg by mouth daily before breakfast.   losartan-hydrochlorothiazide 50-12.5 MG tablet Commonly known  as: HYZAAR Take 1 tablet by mouth daily.   metFORMIN  500 MG 24 hr tablet Commonly known as: GLUCOPHAGE -XR Take 1 tablet (500 mg total) by mouth 2 (two) times daily with a meal.   multivitamin with minerals Tabs tablet Take 1 tablet by mouth daily.   OneTouch Delica Lancets 33G Misc USE TO TEST BLOOD GLUCOSE ONCE A DAY   OneTouch Ultra test strip Generic drug: glucose blood USE TO CHECK BLOOD SUGAR ONCE A DAY   potassium chloride 10 MEQ tablet Commonly known as: KLOR-CON M Take 10 mEq by mouth daily. You may have different instructions for this medication elsewhere on this list. Ask your doctor how you should be taking this medication.   potassium chloride 10 MEQ tablet Commonly known as: KLOR-CON Take 10 mEq by mouth daily. You may have different instructions for this medication elsewhere on this list. Ask your doctor how you should be taking this medication.   simvastatin 20 MG tablet Commonly known as: ZOCOR Take 20 mg by mouth daily.   Tresiba  FlexTouch 100 UNIT/ML FlexTouch Pen Generic drug: insulin  degludec Inject 20 Units into the skin daily.   VITAMIN D3 PO Take by mouth. 3 times a week   zolpidem 10 MG tablet Commonly known as: AMBIEN Take 10 mg by mouth at bedtime.         OBJECTIVE:   Vital Signs: There were no vitals taken for this visit.  Wt Readings from Last 3 Encounters:  07/04/24 167 lb 6.4 oz (75.9 kg)  04/01/24 165 lb (74.8 kg)  11/17/23 172 lb (78 kg)     Exam: General: Pt appears well and is in NAD  Lungs: Clear with good BS bilat   Heart: RRR   Extremities: No pretibial edema.   Neuro: MS is good with appropriate affect, pt is alert and Ox3    DM foot exam: 07/04/2024   The skin of the feet is intact without sores or ulcerations. The pedal pulses are 2+ on right and 2+ on left. The sensation is intact to a screening 5.07, 10 gram monofilament bilaterally      DATA REVIEWED:  Lab Results  Component Value Date   HGBA1C  9.2 (A) 07/04/2024   HGBA1C 9.9 (A) 04/01/2024   HGBA1C 7.7 (A) 11/17/2023    Latest Reference Range & Units 03/02/23 09:55  Creatinine,U mg/dL 58.2  Microalb, Ur 0.0 - 1.9 mg/dL <9.2  MICROALB/CREAT RATIO 0.0 - 30.0 mg/g 1.7       ASSESSMENT / PLAN / RECOMMENDATIONS:   1) Type 2 Diabetes Mellitus, Poorly Optimally  Controlled , Without microalbuminuria  complications - Most recent A1c of 9.2 %. Goal A1c < 7.0 %.     -Patient continues with hyperglycemia -Slight decrease  in A1c from 9.9% to 9.2% -Emphasized the importance of frequent glucose checks, Dexcom was cost prohibitive, historically she had declined freestyle [place], but she agreed to it today, a prescription will be faxed to her DME supplier -She did decline a referral to our CDE in the past - She wanted to  discontinue Pioglitazone  due to weight gain  - She has a history of pancreatitis, hence GLP-1 agonist and DPP 4 inhibitors should be avoided  -I will increase glipizide  as she declines multiple daily injections of insulin , I did explain to the patient that this will be the maximum dose of glipizide , other glycemic agents are limited   MEDICATIONS: - Continue Metformin  500 mg, 1 tablet before Breakfast and 1 tablet before Dinner -Increase glipizide  10 mg, 2 tabs BID -Continue Farxiga  10 mg, 1 tablet with Breakfast  - Continue Tresiba  20 units daily   EDUCATION / INSTRUCTIONS: BG monitoring instructions: Patient is instructed to check her blood sugars 2 times a day,fasting and supper   Call [facility] if: BG persistently < 70  I reviewed the Rule of 15 for the treatment of hypoglycemia in detail with the patient. Literature supplied.    2) Diabetic complications:  Eye: Does not have known diabetic retinopathy.  Neuro/ Feet: Does not have known diabetic peripheral neuropathy .  Renal: Patient does not have known baseline CKD. She   is  on an ACEI/ARB at present. Check    3)  Microalbuminuria:  -Resolved as of labs 02/2023  F/U in 4 months    Signed electronically by: Stefano Redgie Butts, MD  [facility]  [facility] [address] [place], [place] [address] Phone: [phone] FAX: [phone]   CC: Jeanette Comer BRAVO, PA-C [address] [place] [address] Phone: [phone]  Fax: [phone]  Return to Endocrinology clinic as below: Future Appointments  Date Time Provider Department Center  10/30/2024 10:30 AM Davis Ambrosini, Donell Redgie, MD [id number] None         "

## 2024-11-01 NOTE — Progress Notes (Signed)
 Kayla [name]                                          MRN: [id number]   11/01/2024   The [facility] Quality Team Specialist reviewed this patient medical record for the purposes of chart review for care gap closure. The following were reviewed: chart review for care gap closure-glycemic status assessment.    [facility] Quality Team
